# Patient Record
Sex: Male | Born: 1945 | Race: White | Hispanic: No | Marital: Married | Smoking: Former smoker
Health system: Southern US, Community
[De-identification: ages and names within clinical notes are randomized; demographics above are authoritative.]

## PROBLEM LIST (undated history)

## (undated) DIAGNOSIS — I4719 Other supraventricular tachycardia: Secondary | ICD-10-CM

## (undated) DIAGNOSIS — I639 Cerebral infarction, unspecified: Secondary | ICD-10-CM

## (undated) DIAGNOSIS — E119 Type 2 diabetes mellitus without complications: Secondary | ICD-10-CM

## (undated) DIAGNOSIS — I779 Disorder of arteries and arterioles, unspecified: Secondary | ICD-10-CM

## (undated) DIAGNOSIS — R609 Edema, unspecified: Secondary | ICD-10-CM

## (undated) DIAGNOSIS — E875 Hyperkalemia: Secondary | ICD-10-CM

## (undated) DIAGNOSIS — N183 Chronic kidney disease, stage 3 unspecified: Secondary | ICD-10-CM

## (undated) DIAGNOSIS — I447 Left bundle-branch block, unspecified: Secondary | ICD-10-CM

## (undated) DIAGNOSIS — IMO0001 Reserved for inherently not codable concepts without codable children: Secondary | ICD-10-CM

## (undated) DIAGNOSIS — I10 Essential (primary) hypertension: Secondary | ICD-10-CM

## (undated) DIAGNOSIS — Z862 Personal history of diseases of the blood and blood-forming organs and certain disorders involving the immune mechanism: Secondary | ICD-10-CM

## (undated) DIAGNOSIS — I739 Peripheral vascular disease, unspecified: Secondary | ICD-10-CM

## (undated) DIAGNOSIS — I471 Supraventricular tachycardia: Secondary | ICD-10-CM

## (undated) DIAGNOSIS — Z794 Long term (current) use of insulin: Secondary | ICD-10-CM

## (undated) HISTORY — PX: NECK SURGERY: SHX720

## (undated) HISTORY — DX: Personal history of diseases of the blood and blood-forming organs and certain disorders involving the immune mechanism: Z86.2

## (undated) HISTORY — PX: BACK SURGERY: SHX140

---

## 2005-12-04 ENCOUNTER — Ambulatory Visit: Payer: Self-pay | Admitting: Critical Care Medicine

## 2005-12-04 ENCOUNTER — Inpatient Hospital Stay (HOSPITAL_COMMUNITY): Admission: AD | Admit: 2005-12-04 | Discharge: 2005-12-13 | Payer: Self-pay | Admitting: Cardiology

## 2005-12-04 ENCOUNTER — Ambulatory Visit: Payer: Self-pay | Admitting: Cardiology

## 2005-12-28 ENCOUNTER — Ambulatory Visit: Payer: Self-pay | Admitting: Cardiology

## 2016-07-07 ENCOUNTER — Inpatient Hospital Stay (HOSPITAL_COMMUNITY)
Admission: AD | Admit: 2016-07-07 | Discharge: 2016-07-09 | DRG: 638 | Disposition: A | Discharge status: Home / Self Care | Payer: Medicare Other | Source: Other Acute Inpatient Hospital | Attending: Cardiology | Admitting: Cardiology

## 2016-07-07 ENCOUNTER — Encounter (HOSPITAL_COMMUNITY): Payer: Self-pay | Admitting: Student

## 2016-07-07 ENCOUNTER — Inpatient Hospital Stay (HOSPITAL_COMMUNITY): Payer: Medicare Other

## 2016-07-07 DIAGNOSIS — R600 Localized edema: Secondary | ICD-10-CM

## 2016-07-07 DIAGNOSIS — I248 Other forms of acute ischemic heart disease: Secondary | ICD-10-CM | POA: Diagnosis not present

## 2016-07-07 DIAGNOSIS — R0989 Other specified symptoms and signs involving the circulatory and respiratory systems: Secondary | ICD-10-CM

## 2016-07-07 DIAGNOSIS — I447 Left bundle-branch block, unspecified: Secondary | ICD-10-CM | POA: Diagnosis present

## 2016-07-07 DIAGNOSIS — E1122 Type 2 diabetes mellitus with diabetic chronic kidney disease: Secondary | ICD-10-CM | POA: Diagnosis present

## 2016-07-07 DIAGNOSIS — N183 Chronic kidney disease, stage 3 unspecified: Secondary | ICD-10-CM

## 2016-07-07 DIAGNOSIS — I360 Nonrheumatic tricuspid (valve) stenosis: Secondary | ICD-10-CM | POA: Diagnosis not present

## 2016-07-07 DIAGNOSIS — E875 Hyperkalemia: Secondary | ICD-10-CM | POA: Diagnosis present

## 2016-07-07 DIAGNOSIS — Z8673 Personal history of transient ischemic attack (TIA), and cerebral infarction without residual deficits: Secondary | ICD-10-CM

## 2016-07-07 DIAGNOSIS — E11649 Type 2 diabetes mellitus with hypoglycemia without coma: Secondary | ICD-10-CM | POA: Diagnosis present

## 2016-07-07 DIAGNOSIS — I252 Old myocardial infarction: Secondary | ICD-10-CM | POA: Diagnosis not present

## 2016-07-07 DIAGNOSIS — Z8249 Family history of ischemic heart disease and other diseases of the circulatory system: Secondary | ICD-10-CM | POA: Diagnosis not present

## 2016-07-07 DIAGNOSIS — R748 Abnormal levels of other serum enzymes: Secondary | ICD-10-CM | POA: Diagnosis not present

## 2016-07-07 DIAGNOSIS — I129 Hypertensive chronic kidney disease with stage 1 through stage 4 chronic kidney disease, or unspecified chronic kidney disease: Secondary | ICD-10-CM | POA: Diagnosis present

## 2016-07-07 DIAGNOSIS — I739 Peripheral vascular disease, unspecified: Secondary | ICD-10-CM

## 2016-07-07 DIAGNOSIS — R7989 Other specified abnormal findings of blood chemistry: Secondary | ICD-10-CM

## 2016-07-07 DIAGNOSIS — N184 Chronic kidney disease, stage 4 (severe): Secondary | ICD-10-CM | POA: Diagnosis present

## 2016-07-07 DIAGNOSIS — D649 Anemia, unspecified: Secondary | ICD-10-CM | POA: Diagnosis not present

## 2016-07-07 DIAGNOSIS — R4182 Altered mental status, unspecified: Secondary | ICD-10-CM

## 2016-07-07 DIAGNOSIS — Z794 Long term (current) use of insulin: Secondary | ICD-10-CM | POA: Diagnosis not present

## 2016-07-07 DIAGNOSIS — R410 Disorientation, unspecified: Secondary | ICD-10-CM | POA: Diagnosis not present

## 2016-07-07 DIAGNOSIS — E119 Type 2 diabetes mellitus without complications: Secondary | ICD-10-CM

## 2016-07-07 DIAGNOSIS — IMO0001 Reserved for inherently not codable concepts without codable children: Secondary | ICD-10-CM

## 2016-07-07 DIAGNOSIS — R079 Chest pain, unspecified: Secondary | ICD-10-CM | POA: Diagnosis not present

## 2016-07-07 DIAGNOSIS — J449 Chronic obstructive pulmonary disease, unspecified: Secondary | ICD-10-CM | POA: Diagnosis present

## 2016-07-07 DIAGNOSIS — I471 Supraventricular tachycardia: Secondary | ICD-10-CM | POA: Diagnosis present

## 2016-07-07 DIAGNOSIS — D539 Nutritional anemia, unspecified: Secondary | ICD-10-CM

## 2016-07-07 DIAGNOSIS — N179 Acute kidney failure, unspecified: Secondary | ICD-10-CM | POA: Diagnosis present

## 2016-07-07 DIAGNOSIS — E785 Hyperlipidemia, unspecified: Secondary | ICD-10-CM | POA: Diagnosis present

## 2016-07-07 DIAGNOSIS — I10 Essential (primary) hypertension: Secondary | ICD-10-CM

## 2016-07-07 DIAGNOSIS — I779 Disorder of arteries and arterioles, unspecified: Secondary | ICD-10-CM

## 2016-07-07 DIAGNOSIS — R778 Other specified abnormalities of plasma proteins: Secondary | ICD-10-CM | POA: Diagnosis present

## 2016-07-07 DIAGNOSIS — E162 Hypoglycemia, unspecified: Secondary | ICD-10-CM

## 2016-07-07 HISTORY — DX: Cerebral infarction, unspecified: I63.9

## 2016-07-07 HISTORY — DX: Chronic kidney disease, stage 3 (moderate): N18.3

## 2016-07-07 HISTORY — DX: Peripheral vascular disease, unspecified: I73.9

## 2016-07-07 HISTORY — DX: Essential (primary) hypertension: I10

## 2016-07-07 HISTORY — DX: Reserved for inherently not codable concepts without codable children: IMO0001

## 2016-07-07 HISTORY — DX: Disorder of arteries and arterioles, unspecified: I77.9

## 2016-07-07 HISTORY — DX: Other supraventricular tachycardia: I47.19

## 2016-07-07 HISTORY — DX: Long term (current) use of insulin: Z79.4

## 2016-07-07 HISTORY — DX: Hyperkalemia: E87.5

## 2016-07-07 HISTORY — DX: Chronic kidney disease, stage 3 unspecified: N18.30

## 2016-07-07 HISTORY — DX: Edema, unspecified: R60.9

## 2016-07-07 HISTORY — DX: Supraventricular tachycardia: I47.1

## 2016-07-07 HISTORY — DX: Type 2 diabetes mellitus without complications: E11.9

## 2016-07-07 HISTORY — DX: Left bundle-branch block, unspecified: I44.7

## 2016-07-07 LAB — ECHOCARDIOGRAM COMPLETE
Ao-asc: 36 cm
CHL CUP LV S' LATERAL: 6.09 cm/s
FS: 29 % (ref 28–44)
Height: 67 in
IVS/LV PW RATIO, ED: 1.11
LA diam end sys: 35 mm
LA vol index: 35.1 mL/m2
LA vol: 63.9 mL
LADIAMINDEX: 1.92 cm/m2
LASIZE: 35 mm
LAVOLA4C: 51.4 mL
LDCA: 3.14 cm2
LV TDI E'LATERAL: 6.42
LVELAT: 6.42 cm/s
LVOTD: 20 mm
PW: 9 mm — AB (ref 0.6–1.1)
RV LATERAL S' VELOCITY: 12.4 cm/s
RV TAPSE: 18.5 mm
Reg peak vel: 304 cm/s
TDI e' medial: 53.4
TR max vel: 304 cm/s
Weight: 2481.6 oz

## 2016-07-07 LAB — BASIC METABOLIC PANEL
ANION GAP: 6 (ref 5–15)
ANION GAP: 9 (ref 5–15)
BUN: 50 mg/dL — ABNORMAL HIGH (ref 6–20)
BUN: 52 mg/dL — ABNORMAL HIGH (ref 6–20)
CHLORIDE: 108 mmol/L (ref 101–111)
CHLORIDE: 107 mmol/L (ref 101–111)
CO2: 23 mmol/L (ref 22–32)
CO2: 20 mmol/L — AB (ref 22–32)
CREATININE: 1.98 mg/dL — AB (ref 0.61–1.24)
Calcium: 8.6 mg/dL — ABNORMAL LOW (ref 8.9–10.3)
Calcium: 8.6 mg/dL — ABNORMAL LOW (ref 8.9–10.3)
Creatinine, Ser: 1.84 mg/dL — ABNORMAL HIGH (ref 0.61–1.24)
GFR calc non Af Amer: 35 mL/min — ABNORMAL LOW (ref >60)
GFR calc non Af Amer: 32 mL/min — ABNORMAL LOW (ref >60)
GFR, EST AFRICAN AMERICAN: 41 mL/min — AB (ref >60)
GFR, EST AFRICAN AMERICAN: 37 mL/min — AB (ref >60)
GLUCOSE: 98 mg/dL (ref 65–99)
Glucose, Bld: 145 mg/dL — ABNORMAL HIGH (ref 65–99)
POTASSIUM: 6.5 mmol/L — AB (ref 3.5–5.1)
Potassium: 6.3 mmol/L (ref 3.5–5.1)
SODIUM: 136 mmol/L (ref 135–145)
Sodium: 137 mmol/L (ref 135–145)

## 2016-07-07 LAB — GLUCOSE, CAPILLARY
GLUCOSE-CAPILLARY: 137 mg/dL — AB (ref 65–99)
GLUCOSE-CAPILLARY: 182 mg/dL — AB (ref 65–99)
Glucose-Capillary: 67 mg/dL (ref 65–99)
Glucose-Capillary: 99 mg/dL (ref 65–99)
Glucose-Capillary: 205 mg/dL — ABNORMAL HIGH (ref 65–99)

## 2016-07-07 LAB — MRSA PCR SCREENING: MRSA BY PCR: INVALID — AB

## 2016-07-07 LAB — TROPONIN I: Troponin I: 0.07 ng/mL (ref <0.03)

## 2016-07-07 MED ORDER — PNEUMOCOCCAL VAC POLYVALENT 25 MCG/0.5ML IJ INJ: 0.5000 mL | INJECTION | INTRAMUSCULAR | Status: DC

## 2016-07-07 MED ORDER — LISINOPRIL 40 MG PO TABS
40.0000 mg | ORAL_TABLET | Freq: Every day | ORAL | Status: DC
  Administered 2016-07-07: 40 mg via ORAL
  Filled 2016-07-07: qty 2
  Filled 2016-07-07: qty 1

## 2016-07-07 MED ORDER — SODIUM POLYSTYRENE SULFONATE 15 GM/60ML PO SUSP
30.0000 g | Freq: Once | ORAL | Status: AC
  Administered 2016-07-07: 30 g via ORAL
  Filled 2016-07-07: qty 120

## 2016-07-07 MED ORDER — METOPROLOL SUCCINATE ER 50 MG PO TB24
50.0000 mg | ORAL_TABLET | Freq: Every day | ORAL | Status: DC
  Administered 2016-07-07 – 2016-07-09 (×3): 50 mg via ORAL
  Filled 2016-07-07 (×3): qty 1

## 2016-07-07 MED ORDER — PANTOPRAZOLE SODIUM 40 MG PO TBEC
40.0000 mg | DELAYED_RELEASE_TABLET | Freq: Every day | ORAL | Status: DC
  Administered 2016-07-07 – 2016-07-09 (×3): 40 mg via ORAL
  Filled 2016-07-07 (×3): qty 1

## 2016-07-07 MED ORDER — ONDANSETRON HCL 4 MG/2ML IJ SOLN
4.0000 mg | Freq: Four times a day (QID) | INTRAMUSCULAR | Status: DC | PRN
  Filled 2016-07-07: qty 2

## 2016-07-07 MED ORDER — NITROGLYCERIN 0.4 MG SL SUBL: 0.4000 mg | SUBLINGUAL_TABLET | SUBLINGUAL | Status: DC | PRN

## 2016-07-07 MED ORDER — ASPIRIN EC 81 MG PO TBEC
81.0000 mg | DELAYED_RELEASE_TABLET | Freq: Every day | ORAL | Status: DC
  Administered 2016-07-07 – 2016-07-09 (×3): 81 mg via ORAL
  Filled 2016-07-07 (×3): qty 1

## 2016-07-07 MED ORDER — SPIRONOLACTONE 25 MG PO TABS
50.0000 mg | ORAL_TABLET | Freq: Every day | ORAL | Status: DC
  Administered 2016-07-07: 50 mg via ORAL
  Filled 2016-07-07 (×2): qty 2

## 2016-07-07 MED ORDER — DEXTROSE 50 % IV SOLN: 25.0000 mL | Freq: Once | INTRAVENOUS | Status: DC

## 2016-07-07 MED ORDER — ATORVASTATIN CALCIUM 20 MG PO TABS
20.0000 mg | ORAL_TABLET | Freq: Every day | ORAL | Status: DC
  Administered 2016-07-07 – 2016-07-08 (×2): 20 mg via ORAL
  Filled 2016-07-07 (×2): qty 1

## 2016-07-07 MED ORDER — INSULIN ASPART 100 UNIT/ML ~~LOC~~ SOLN
0.0000 [IU] | Freq: Three times a day (TID) | SUBCUTANEOUS | Status: DC
  Administered 2016-07-07: 3 [IU] via SUBCUTANEOUS
  Administered 2016-07-07: 2 [IU] via SUBCUTANEOUS
  Administered 2016-07-08: 5 [IU] via SUBCUTANEOUS
  Administered 2016-07-08: 2 [IU] via SUBCUTANEOUS
  Administered 2016-07-09: 5 [IU] via SUBCUTANEOUS

## 2016-07-07 MED ORDER — ENOXAPARIN SODIUM 40 MG/0.4ML ~~LOC~~ SOLN
40.0000 mg | SUBCUTANEOUS | Status: DC
  Filled 2016-07-07: qty 0.4

## 2016-07-07 MED ORDER — ACETAMINOPHEN 325 MG PO TABS: 650.0000 mg | ORAL_TABLET | ORAL | Status: DC | PRN

## 2016-07-07 MED ORDER — DEXTROSE 50 % IV SOLN: 25.0000 mL | Freq: Once | INTRAVENOUS | Status: AC

## 2016-07-07 MED ORDER — FUROSEMIDE 20 MG PO TABS
20.0000 mg | ORAL_TABLET | Freq: Every day | ORAL | Status: DC
  Administered 2016-07-07: 20 mg via ORAL
  Filled 2016-07-07 (×2): qty 1

## 2016-07-07 NOTE — Progress Notes (Signed)
  Notified of patient's elevated K+ of 6.3. Will give Kayexalate 30 g now. Repeat BMET at 1500.  Signed, Erma Heritage, PA-C 07/07/2016, 10:52 AM Pager: [phone]

## 2016-07-07 NOTE — Progress Notes (Signed)
Tech offered Pt a bath. Pt sitting in chair going to sleep and stated she will take care her bath later.  Tech asked Pt would she like some assistance with bath. Pt stated no.

## 2016-07-07 NOTE — Progress Notes (Signed)
[place], [place] for Cardiology at bedside for eval; pt AxO x4; neuro assessment negative; speech clear.  Pt requests something to drink.  According to record, pt passed swallow eval; given OJ instead of 1/2 amp D50 per order of [place], [place].  Await further orders.

## 2016-07-07 NOTE — Progress Notes (Signed)
Echocardiogram 2D Echocardiogram has been performed.  Joelene Millin 07/07/2016, 12:04 PM

## 2016-07-07 NOTE — Progress Notes (Signed)
CRITICAL VALUE ALERT  Critical Value:  K+ 6.3; Trop .007  Date & Time Notified:  07/07/2016 @ 1015  Provider Notified: Bernerd Pho, PA for Cardiology  Orders Received/Actions taken: [place] states she will address; called to stat procedure

## 2016-07-07 NOTE — Progress Notes (Signed)
Have attempted to call report to 2W x 2 unsuccessfully.  Will continue attempts.

## 2016-07-07 NOTE — Progress Notes (Signed)
Pt arrived on unit. Vitals stable. Pt alert and oriented X4. No orders received. CBG 64. Will recheck. Paged MD for orders.

## 2016-07-07 NOTE — Progress Notes (Signed)
*  PRELIMINARY RESULTS* Vascular Ultrasound Carotid Duplex (Doppler) has been completed.  Preliminary findings: Findings consistent with a high end 1-39 percent stenosis involving the right internal carotid artery and the left external carotid artery.   Stenosis could be underestimated due to large amount of calcified plaque.  Bilateral vertebral arteries appear patent and antegrade.  Everrett Coombe 07/07/2016, 2:51 PM

## 2016-07-07 NOTE — H&P (Signed)
History & Physical    Patient ID: Larry Mitchell MRN: [id number], DOB/AGE: July 21, 1945   Admit date: 07/07/2016   Primary Physician: [facility], [place] Primary Cardiologist: New to [facility] - Followed by [place], [place]   History of Present Illness    Larry Mitchell is a 71 y.o. male with past medical history of IDDM, COPD, HTN, HLD, and Stage 3 CKD who presents to [facility] on 07/07/2016 for evaluation of weakness, unresponsiveness, and elevated troponin values.   He presented to [facility] on 07/06/2016 for an episode of bilateral upper extremity weakness and unresponsiveness. The patient's wife reported he was last normal at 2030 the evening of presentation but she awoke from sleep to find him on the groaning and minimally responsive. She initially thought his glucose levels were low, so she administered Glucagon and called 911. Last night, he does recall taking his 21 units of Humulin 70/30 and then falling asleep without consuming any food. Last meal had been earlier in the day around lunchtime.   He reports being in his usual state of health for the past several weeks. 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 He denies any recent chest pain, dyspnea on exertion, orthopnea, PND, or palpitations. Does have chronic lower extremity edema which is followed by the [facility] (on Lasix '20mg'$  daily PTA). Says weight has been stable at 150 - 155 lbs on his home scales.  He reports having a cardiac catheterization 10+ years ago which was normal. No stent placement or PCI performed at that time. Had an echocardiogram last month at [facility] but he is unable to recall the results of this study.   Labs showed WBC of 10.6, Hgb 9.3, platelet count 198, creatinine 1.81 (unknown baseline), glucose 289, Alk Phos 97, AST 67.5, ALT 41. Na+ 135, K+ 5.2. Troponin 0.05 with repeat value of 0.09. EKG shows NSR, HR 70, with LBBB  (unknown old vs. new). CT Head showed no evidence of large-vessel occlusion. Plaque along the carotid arteries was noted with 40% stenosis along the right and 25% along the left.   Past Medical History    Past Medical History:  Diagnosis Date  . CKD (chronic kidney disease) stage 3, GFR 30-59 ml/min   . Essential hypertension   . IDDM (insulin dependent diabetes mellitus) ([place])     No past surgical history on file.   Allergies  Allergies not on file   Home Medications    Prior to Admission medications   Not on File    Family History    Family History  Problem Relation Age of Onset  . Heart failure Mother        Age of onset - 18    Social History    Social History   Social History  . Marital status: Married    Spouse name: [name]  . Number of children: [name]  . Years of education: [name]   Occupational History  . Not on file.   Social History Main Topics  . Smoking status: Never Smoker  . Smokeless tobacco: Never Used  . Alcohol use 0.6 oz/week    1 Cans of beer per week  . Drug use: No  . Sexual activity: Not on file   Other Topics Concern  . Not on file   Social History Narrative  . No narrative on file     Review of Systems  General:  No chills, fever, night sweats or weight changes.  Cardiovascular:  No chest pain, dyspnea on exertion, edema, orthopnea, palpitations, paroxysmal nocturnal dyspnea. Dermatological: No rash, lesions/masses Respiratory: No cough, dyspnea Urologic: No hematuria, dysuria Abdominal:   No nausea, vomiting, diarrhea, bright red blood per rectum, melena, or hematemesis Neurologic:  No visual changes. Positive for wkns and changes in mental status.  All other systems reviewed and are otherwise negative except as noted above.  Physical Exam    Blood pressure (!) 143/57, pulse 64, temperature 97.9 F (36.6 C), temperature source Oral, resp. rate 15, height 5\' 7"  (1.702 m), weight 155 lb 1.6 oz (70.4 kg), SpO2 100 %.    General: Well developed, well nourished Caucasian male appearing in no acute distress. Head: Normocephalic, atraumatic, sclera non-icteric, no xanthomas, nares are without discharge. Neck: Left carotid bruit noted. JVD not elevated.  Lungs: Respirations regular and unlabored, without wheezes or rales.  Heart: Regular rate and rhythm. No S3 or S4.  No murmur, no rubs, or gallops appreciated. Abdomen: Soft, non-tender, non-distended with normoactive bowel sounds. No hepatomegaly. No rebound/guarding. No obvious abdominal masses. Msk:  Strength and tone appear normal for age. No joint deformities or effusions. Extremities: No clubbing or cyanosis. 1+ pitting edema up to mid-shins bilaterally.  Distal pedal pulses are 2+ bilaterally. Neuro: Alert and oriented X 3. Moves all extremities spontaneously. No focal deficits noted. Psych:  Responds to questions appropriately with a normal affect. Skin: No rashes or lesions noted  Labs    Troponin (Point of Care Test) No results for input(s): TROPIPOC in the last 72 hours. No results for input(s): CKTOTAL, CKMB, TROPONINI in the last 72 hours. No results found for: WBC, HGB, HCT, MCV, PLT No results for input(s): NA, K, CL, CO2, BUN, CREATININE, CALCIUM, PROT, BILITOT, ALKPHOS, ALT, AST, GLUCOSE in the last 168 hours.  Invalid input(s): LABALBU No results found for: CHOL, HDL, LDLCALC, TRIG No results found for: DDIMER  No results found for: BNP No results found for: PROBNP No results for input(s): INR in the last 72 hours.    Radiology Studies    CT Head (at [facility] -  showed no evidence of large-vessel occlusion. Plaque along the carotid arteries was noted with 40% stenosis along the right and 25% along the left.    EKG & Cardiac Imaging    EKG: NSR, HR 70, with LBBB (unknown old vs. new). - Personally Reviewed  ECHOCARDIOGRAM: None on File  Assessment & Plan    1. Elevated Troponin  - the patient presented to [facility]  with weakness and AMS, occurring in the setting of presumed hypoglycemia. He denies any recent chest pain or dyspnea on exertion but troponin values were checked while in the ED and found to be elevated at 0.05 and 0.09. - EKG shows NSR, HR 70, with LBBB (unknown old vs. new). - he does have multiple cardiac risk factors including IDDM, HTN, and HLD. Last cardiac catheterization was 10+ years ago.  - will discuss further with [facility]. Would favor ischemic evaluation with a NST over cardiac catheterization at this time in the setting of his renal insufficiency. Recheck troponin value.   2. IDDM - on Humulin 70/30 as an outpatient. Hypoglycemic at 73 this AM.  - SSI while admitted until glucose levels normalize. Consult Diabetes Coordinator.   3. Stage 3 CKD - creatinine at 1.81 at [facility] (unknown baseline). - repeat BMET pending.  4. Lower Extremity Edema - chronic  according to the patient. Just had an echo performed last month at [facility].  - continue PTA Lasix. Hold K+ supplementation as K+ was at 5.2 on most recent check.   5. AMS - occurred in the setting of assumed hypoglycemia with resolution of his symptoms following administration of Glucagon. - CT Head at OSH without acute intracranial abnormalities.  - continue to monitor.   6. Anemia - Hgb at 9.3. Unknown baseline.  - he denies any evidence of active bleeding. - will need to be further evaluated as an outpatient.   Signed, Erma Heritage, PA-C 07/07/2016, 8:37 AM Pager: [phone]  History and all data above reviewed.  Patient examined.  I agree with the findings as above. The patient was admitted with AMS after not eating and taking his full insulin.  This is apparently a pattern.  He has poorly controlled DM and has infrequent follow up at the [place] and no local MDs.  His wife says that he has a poor lifestyle and much of his poor sugar management is secondary to his compliance or understanding.  He had  elevated troponin.  He denies any chest pain or SOB.  He does still work.  He looks to be chronically ill but reports no acute complaints.  He has chronic leg weakness. The patient exam reveals COR:RRR  ,  Lungs: Clear  ,  Abd: Distended, Ext Moderate leg edema,  Neck:right Bruit  .  All available labs, radiology testing, previous records reviewed. Agree with documented assessment and plan. AMS :  This was likely related to low BS.  We will ask the diabetes nurse educator to get involved.  Elevated troponin:  I don't strongly suspect an acute coronary syndrome.  However, he has significant risk factors and I would like to screen with a Lexiscan Myoview.    Edema:  Check echocardiogram.   Bruit:  Check Doppler.   Jeneen Rinks Chyanna Flock  9:40 AM  07/07/2016

## 2016-07-08 ENCOUNTER — Inpatient Hospital Stay (HOSPITAL_COMMUNITY): Payer: Medicare Other

## 2016-07-08 DIAGNOSIS — R748 Abnormal levels of other serum enzymes: Secondary | ICD-10-CM

## 2016-07-08 DIAGNOSIS — R079 Chest pain, unspecified: Secondary | ICD-10-CM

## 2016-07-08 LAB — BASIC METABOLIC PANEL
Anion gap: 7 (ref 5–15)
BUN: 50 mg/dL — ABNORMAL HIGH (ref 6–20)
CALCIUM: 8 mg/dL — AB (ref 8.9–10.3)
CO2: 20 mmol/L — ABNORMAL LOW (ref 22–32)
CREATININE: 2.2 mg/dL — AB (ref 0.61–1.24)
Chloride: 107 mmol/L (ref 101–111)
GFR calc non Af Amer: 28 mL/min — ABNORMAL LOW (ref >60)
GFR, EST AFRICAN AMERICAN: 33 mL/min — AB (ref >60)
Glucose, Bld: 143 mg/dL — ABNORMAL HIGH (ref 65–99)
Potassium: 5.6 mmol/L — ABNORMAL HIGH (ref 3.5–5.1)
SODIUM: 134 mmol/L — AB (ref 135–145)

## 2016-07-08 LAB — NM MYOCAR MULTI W/SPECT W/WALL MOTION / EF
CHL CUP RESTING HR STRESS: 65 {beats}/min
CSEPEDS: 0 s
CSEPHR: 54 %
CSEPPHR: 80 {beats}/min
Estimated workload: 1 METS
Exercise duration (min): 0 min
MPHR: 148 {beats}/min

## 2016-07-08 LAB — VAS US CAROTID
LCCADSYS: -93 cm/s
LCCAPDIAS: 26 cm/s
LCCAPSYS: 108 cm/s
LEFT ECA DIAS: -22 cm/s
LEFT VERTEBRAL DIAS: -15 cm/s
LICAPDIAS: -39 cm/s
Left CCA dist dias: -21 cm/s
Left ICA dist dias: -30 cm/s
Left ICA dist sys: -132 cm/s
Left ICA prox sys: -226 cm/s
RCCAPDIAS: 13 cm/s
RCCAPSYS: 97 cm/s
RIGHT ECA DIAS: 24 cm/s
RIGHT VERTEBRAL DIAS: -33 cm/s
Right cca dist sys: -133 cm/s

## 2016-07-08 LAB — TROPONIN I: TROPONIN I: 0.12 ng/mL — AB (ref <0.03)

## 2016-07-08 LAB — GLUCOSE, CAPILLARY
GLUCOSE-CAPILLARY: 110 mg/dL — AB (ref 65–99)
Glucose-Capillary: 148 mg/dL — ABNORMAL HIGH (ref 65–99)
Glucose-Capillary: 222 mg/dL — ABNORMAL HIGH (ref 65–99)
Glucose-Capillary: 210 mg/dL — ABNORMAL HIGH (ref 65–99)

## 2016-07-08 LAB — MRSA CULTURE: CULTURE: NOT DETECTED

## 2016-07-08 MED ORDER — ALBUTEROL SULFATE (2.5 MG/3ML) 0.083% IN NEBU
2.5000 mg | INHALATION_SOLUTION | Freq: Four times a day (QID) | RESPIRATORY_TRACT | Status: DC | PRN
  Administered 2016-07-08: 2.5 mg via RESPIRATORY_TRACT
  Filled 2016-07-08: qty 3

## 2016-07-08 MED ORDER — TECHNETIUM TC 99M TETROFOSMIN IV KIT
31.0000 | PACK | Freq: Once | INTRAVENOUS | Status: AC | PRN
  Administered 2016-07-08: 31 via INTRAVENOUS

## 2016-07-08 MED ORDER — TIOTROPIUM BROMIDE MONOHYDRATE 18 MCG IN CAPS
18.0000 ug | ORAL_CAPSULE | Freq: Every day | RESPIRATORY_TRACT | Status: DC
  Administered 2016-07-08 – 2016-07-09 (×2): 18 ug via RESPIRATORY_TRACT
  Filled 2016-07-08 (×2): qty 5

## 2016-07-08 MED ORDER — ALBUTEROL SULFATE HFA 108 (90 BASE) MCG/ACT IN AERS: 1.0000 | INHALATION_SPRAY | Freq: Four times a day (QID) | RESPIRATORY_TRACT | Status: DC | PRN

## 2016-07-08 MED ORDER — REGADENOSON 0.4 MG/5ML IV SOLN
INTRAVENOUS | Status: AC
  Filled 2016-07-08: qty 5

## 2016-07-08 MED ORDER — TECHNETIUM TC 99M TETROFOSMIN IV KIT
10.0000 | PACK | Freq: Once | INTRAVENOUS | Status: AC | PRN
  Administered 2016-07-08: 10 via INTRAVENOUS

## 2016-07-08 MED ORDER — REGADENOSON 0.4 MG/5ML IV SOLN
0.4000 mg | Freq: Once | INTRAVENOUS | Status: AC
  Administered 2016-07-08: 0.4 mg via INTRAVENOUS
  Filled 2016-07-08: qty 5

## 2016-07-08 MED ORDER — ENOXAPARIN SODIUM 30 MG/0.3ML ~~LOC~~ SOLN
30.0000 mg | SUBCUTANEOUS | Status: DC
  Administered 2016-07-08: 30 mg via SUBCUTANEOUS
  Filled 2016-07-08: qty 0.3

## 2016-07-08 NOTE — Progress Notes (Signed)
CCMD called to notify pt had 15 beats of wide QRS with tachycardia in 150s.  Pt now NSR in 80s and resting comfortably in bed.  CCMD saved strip for review.

## 2016-07-08 NOTE — Progress Notes (Signed)
Progress Note  Patient Name: Larry Mitchell Date of Encounter: 07/08/2016  Primary Cardiologist: [place], [place]   He denies any recent chest pain or palpitations. Breathing at baseline. Seen in Nuclear Medicine for 1-day NST.   Inpatient Medications    Scheduled Meds: . regadenoson      . aspirin EC  81 mg Oral Daily  . atorvastatin  20 mg Oral q1800  . dextrose  25 mL Intravenous Once  . enoxaparin (LOVENOX) injection  40 mg Subcutaneous Q24H  . furosemide  20 mg Oral Daily  . insulin aspart  0-15 Units Subcutaneous TID WC  . lisinopril  40 mg Oral Daily  . metoprolol succinate  50 mg Oral Daily  . pantoprazole  40 mg Oral Daily  . pneumococcal 23 valent vaccine  0.5 mL Intramuscular Tomorrow-1000  . spironolactone  50 mg Oral Daily   Continuous Infusions:  PRN Meds: acetaminophen, nitroGLYCERIN, ondansetron (ZOFRAN) IV   Vital Signs    Vitals:   07/07/16 1938 07/08/16 0411 07/08/16 0900 07/08/16 0930  BP: (!) 138/53 (!) 138/49 (!) 148/57 (!) 142/56  Pulse: 73 79    Resp: 18 18    Temp: 99.1 F (37.3 C) 98.4 F (36.9 C)    TempSrc: Oral Oral    SpO2: 95% 93%    Weight:  152 lb 12.8 oz (69.3 kg)    Height:        Intake/Output Summary (Last 24 hours) at 07/08/16 0932 Last data filed at 07/07/16 0945  Gross per 24 hour  Intake              240 ml  Output                0 ml  Net              240 ml   Filed Weights   07/07/16 0623 07/08/16 0411  Weight: 155 lb 1.6 oz (70.4 kg) 152 lb 12.8 oz (69.3 kg)    Telemetry    Not reviewed, examined in Nuclear Medicine. - Personally Reviewed  ECG    SR with 1st degree AV Block, HR 74. - Personally Reviewed  Physical Exam   General: Well developed, well nourished Caucasian male appearing in no acute distress. Head: Normocephalic, atraumatic.  Neck: Right carotid bruit noted, JVD not elevated. Lungs:  Resp regular and unlabored, CTA without wheezing or rales. Heart: RRR, S1, S2, no S3, S4, or  murmur; no rub. Abdomen: Soft, non-tender, non-distended with normoactive bowel sounds. No hepatomegaly. No rebound/guarding. No obvious abdominal masses. Extremities: No clubbing or cyanosis. Chronic lower extremity edema. Distal pedal pulses are 2+ bilaterally. Neuro: Alert and oriented X 3. Moves all extremities spontaneously. Psych: Normal affect.  Labs    Chemistry Recent Labs Lab 07/07/16 0905 07/07/16 1333 07/08/16 0149  NA 137 136 134*  K 6.3* 6.5* 5.6*  CL 108 107 107  CO2 23 20* 20*  GLUCOSE 98 145* 143*  BUN 50* 52* 50*  CREATININE 1.84* 1.98* 2.20*  CALCIUM 8.6* 8.6* 8.0*  GFRNONAA 35* 32* 28*  GFRAA 41* 37* 33*  ANIONGAP 6 9 7      HematologyNo results for input(s): WBC, RBC, HGB, HCT, MCV, MCH, MCHC, RDW, PLT in the last 168 hours.  Cardiac Enzymes Recent Labs Lab 07/07/16 0905 07/08/16 0149  TROPONINI 0.07* 0.12*   No results for input(s): TROPIPOC in the last 168 hours.   BNPNo results for input(s): BNP, PROBNP in the  last 168 hours.   DDimer No results for input(s): DDIMER in the last 168 hours.   Radiology    No results found.  Cardiac Studies   Echocardiogram: 07/07/2016 Study Conclusions  - Left ventricle: The cavity size was normal. Wall thickness was   normal. Systolic function was normal. The estimated ejection   fraction was in the range of 55% to 60%. Wall motion was normal;   there were no regional wall motion abnormalities. Diastolic   dysfunction, grade indeterminate. - Mitral valve: Mildly calcified annulus. Normal thickness leaflets   . - Left atrium: The atrium was mildly dilated. - Tricuspid valve: There was mild regurgitation.   Patient Profile     71 y.o. male with past medical history of IDDM, COPD, HTN, HLD, and Stage 3 CKD who presented to [facility] on 07/07/2016 for evaluation of weakness, unresponsiveness, and elevated troponin values as a transfer from [place]    1. Elevated Troponin   - the patient presented to [facility] with weakness and AMS, occurring in the setting of presumed hypoglycemia. He denies any recent chest pain or dyspnea on exertion but troponin values were checked while in the ED and found to be elevated at 0.05, 0.09, and 0.12. - EKG shows NSR, HR 70, with LBBB (unknown old vs. new). - he does have multiple cardiac risk factors including IDDM, HTN, and HLD. Last cardiac catheterization was 10+ years ago.  - seen in Nuclear Medicine for 1-day NST. Official read pending by [facility] following stress images.   2. IDDM - on Humulin 70/30 as an outpatient.  - SSI while admitted until glucose levels normalize. Diabetes Coordinator has been consulted.   3. Stage 3 CKD  - creatinine at 1.81 at [facility]. Elevated to 2.20 this AM.  - unknown baseline. Repeat BMET in AM.   4. Lower Extremity Edema - chronic according to the patient. Echo shows a preserved EF of 55-60%.  - continue PTA Lasix.   5. AMS - occurred in the setting of assumed hypoglycemia with resolution of his symptoms following administration of- K+ supplementation held. May need to consider holdi Glucagon. - CT Head at OSH without acute intracranial abnormalities.  - continue to monitor.   6. Anemia - Hgb at 9.3. Unknown baseline.  - he denies any evidence of active bleeding. - will need to be further evaluated as an outpatient.   7. Hyperkalemia - occurring in the setting of hyperglycemia. Given Kayexalate 30g yesterday afternoon with K+ improved to 5.6 this AM. Consider discontinuing Lisinopril if this remains an issue.   8. Right Carotid Bruit - prelimiary read on doppler study shows 1-39% stenosis bilaterally. Could be underestimated due to calcified plaque.   Arna Medici , PA-C 9:32 AM 07/08/2016 Pager: [phone]  History and all data above reviewed.  Patient examined.  I agree with the findings as above. He has had no chest pain.  He has not  felt palpitations.  He denies any presyncope or syncope.  He has been up in the room.  Echo and carotid studies as above.  He did have what appears to be NSVT.  The patient exam reveals COR:RRR  ,  Lungs: Clear  ,  Abd: Positive bowel sounds, no rebound no guarding, Ext Edema reduced  .  All available labs, radiology testing, previous records reviewed. Agree with documented assessment and plan. ELEVATED TROPONIN:  Lexiscan Myoview results pending.  No further work up  if negative.  Hyperkalemia:  Given this and CKD I am going to stop the ACE inhibitor and spironolactone.  I am also going to hold the Lasix..  Need to follow potassium and creat in AM. BP meds will have to be decided in the AM.  I suspect he will be able to go home pending his labs and overnight tele.  Needs out patient event monitor.   Jeneen Rinks Khyle Goodell  12:28 PM  07/08/2016

## 2016-07-08 NOTE — Progress Notes (Signed)
Stress Test showed:    LBBB present.  Defect 1: There is a medium defect of moderate severity present in the mid inferoseptal, mid inferior, mid inferolateral, apical septal, apical inferior and apical lateral location.  Findings consistent with prior myocardial infarction with a mild degree of peri-infarct ischemia.  This is an intermediate risk study.  Nuclear stress EF: 48%  The patient and his wife both report he had a known MI 10+ years ago at which time PCI was not performed. With him denying any recent anginal symptoms and creatinine continuing to trend upwards, would not pursue a cardiac catheterization at this time. Continue to monitor on telemetry overnight. Plan for repeat BMET in AM to assess creatinine and kidney function. Possible discharge tomorrow if these stabilize.   Signed, Erma Heritage, PA-C 07/08/2016, 4:24 PM Pager: [phone]

## 2016-07-09 ENCOUNTER — Encounter (HOSPITAL_COMMUNITY): Payer: Self-pay | Admitting: Physician Assistant

## 2016-07-09 DIAGNOSIS — I447 Left bundle-branch block, unspecified: Secondary | ICD-10-CM | POA: Diagnosis present

## 2016-07-09 DIAGNOSIS — E875 Hyperkalemia: Secondary | ICD-10-CM

## 2016-07-09 DIAGNOSIS — I248 Other forms of acute ischemic heart disease: Secondary | ICD-10-CM

## 2016-07-09 DIAGNOSIS — I739 Peripheral vascular disease, unspecified: Secondary | ICD-10-CM

## 2016-07-09 DIAGNOSIS — D539 Nutritional anemia, unspecified: Secondary | ICD-10-CM

## 2016-07-09 DIAGNOSIS — I10 Essential (primary) hypertension: Secondary | ICD-10-CM

## 2016-07-09 DIAGNOSIS — R600 Localized edema: Secondary | ICD-10-CM

## 2016-07-09 DIAGNOSIS — I471 Supraventricular tachycardia: Secondary | ICD-10-CM

## 2016-07-09 DIAGNOSIS — I779 Disorder of arteries and arterioles, unspecified: Secondary | ICD-10-CM

## 2016-07-09 DIAGNOSIS — R410 Disorientation, unspecified: Secondary | ICD-10-CM

## 2016-07-09 DIAGNOSIS — E162 Hypoglycemia, unspecified: Secondary | ICD-10-CM

## 2016-07-09 LAB — BASIC METABOLIC PANEL
ANION GAP: 10 (ref 5–15)
BUN: 49 mg/dL — ABNORMAL HIGH (ref 6–20)
CALCIUM: 8.2 mg/dL — AB (ref 8.9–10.3)
CO2: 20 mmol/L — AB (ref 22–32)
Chloride: 103 mmol/L (ref 101–111)
Creatinine, Ser: 2.23 mg/dL — ABNORMAL HIGH (ref 0.61–1.24)
GFR, EST AFRICAN AMERICAN: 32 mL/min — AB (ref >60)
GFR, EST NON AFRICAN AMERICAN: 28 mL/min — AB (ref >60)
GLUCOSE: 211 mg/dL — AB (ref 65–99)
POTASSIUM: 5.1 mmol/L (ref 3.5–5.1)
Sodium: 133 mmol/L — ABNORMAL LOW (ref 135–145)

## 2016-07-09 LAB — CBC
HEMATOCRIT: 26.9 % — AB (ref 39.0–52.0)
Hemoglobin: 8.9 g/dL — ABNORMAL LOW (ref 13.0–17.0)
MCH: 32 pg (ref 26.0–34.0)
MCHC: 33.1 g/dL (ref 30.0–36.0)
MCV: 96.8 fL (ref 78.0–100.0)
PLATELETS: 186 10*3/uL (ref 150–400)
RBC: 2.78 MIL/uL — ABNORMAL LOW (ref 4.22–5.81)
RDW: 12.5 % (ref 11.5–15.5)
WBC: 8 10*3/uL (ref 4.0–10.5)

## 2016-07-09 LAB — GLUCOSE, CAPILLARY
Glucose-Capillary: 223 mg/dL — ABNORMAL HIGH (ref 65–99)
Glucose-Capillary: 215 mg/dL — ABNORMAL HIGH (ref 65–99)

## 2016-07-09 MED ORDER — INSULIN NPH ISOPHANE & REGULAR (70-30) 100 UNIT/ML ~~LOC~~ SUSP: 15.0000 [IU] | Freq: Two times a day (BID) | SUBCUTANEOUS | Status: AC

## 2016-07-09 MED ORDER — ASPIRIN 81 MG PO TABS: 81.0000 mg | ORAL_TABLET | Freq: Every day | ORAL | Status: AC

## 2016-07-09 NOTE — Discharge Summary (Signed)
Discharge Summary    Patient ID: Larry Mitchell,  MRN: [id number], DOB/AGE: 04-30-1945 71 y.o.  Admit date: 07/07/2016 Discharge date: 07/09/2016  Primary Care Provider: [facility], [place] Primary Cardiologist: New, usually followed for care at [facility] - will follow up with [facility] but ultimately would prefer f/u in [place] near where he lives  Discharge Diagnoses    Principal Problem:   Elevated troponin Active Problems:   Altered mental state   IDDM (insulin dependent diabetes mellitus) (HCC)   CKD (chronic kidney disease) stage 3, GFR 30-59 ml/min   Hypoglycemia   Anemia, unspecified   Hyperkalemia   Essential hypertension   Carotid artery disease (HCC)   PAT (paroxysmal atrial tachycardia) (HCC)   Lower extremity edema   LBBB (left bundle branch block)    Diagnostic Studies/Procedures    2D echo 07/07/16 Study Conclusions  - Left ventricle: The cavity size was normal. Wall thickness was   normal. Systolic function was normal. The estimated ejection   fraction was in the range of 55% to 60%. Wall motion was normal;   there were no regional wall motion abnormalities. Diastolic   dysfunction, grade indeterminate. - Mitral valve: Mildly calcified annulus. Normal thickness leaflets   . - Left atrium: The atrium was mildly dilated. - Tricuspid valve: There was mild regurgitation. _____________     History of Present Illness     Larry Mitchell is a 71 y.o. male with history of IDDM, COPD, HTN, HLD, CKD stage III (followed at [place]), chronic LEE, occult stroke (seen on CT at OSH this admission), possible remote MI >10 years ago who was admitted to [facility] upon transfer from [facility] for elevated troponin - had presented with weakness, unresponsiveness/groaning/drooling, felt due to hypoglycemia in the setting of taking insulin without eating.   Hospital Course    He presented to [facility] on 07/06/2016 for an episode of bilateral upper extremity weakness and  unresponsiveness. The patient's wife reported he was last normal at 2030 the evening of presentation but she awoke from sleep to find him on the groaning and minimally responsive. She initially thought his glucose levels were low, so she administered Glucagon and called 911. This has happened in the past. He improved with this treatment and later recalled taking his 21 units of Humulin 70/30 and then falling asleep without consuming any food (had last eaten at lunchtime). Otherwise he denied any recent CP, dyspnea on exertion, orthopnea, PND, or palpitations. He did report chronic lower extremity edema which is followed by the [facility] (on Lasix 20mg  daily PTA). Labs showed Cr 1.81 (unknown baseline but patient reports CKD for 3-4 years), hyperkalemia, mild troponin elevation 0.05 with repeat value of 0.09. EKG shows NSR, HR 70, with LBBB (unknown old vs. new). CT Head showed no evidence of large-vessel occlusion; was mention of occult infarct on imaging. There was plaque along the carotid arteries was noted with 40% stenosis along the right and 25% along the left. Due to his EKG and troponins he was transferred to [facility] for further evaluation.   The following issues were addressed:  1. Altered mental status - occurred in the setting of assumed hypoglycemia with resolution of his symptoms following administration of Glucagon. Will need close f/u with primary care to manage his insulin regimen. This occurred after administering insulin then going to bed without eating. At discharge, his home regimen was reduced from 21u BID to 15u BID. Given the description of the event, Dr. Marlou Porch  did not feel he required event monitor at discharge. Telemetry showed brief run of PAT but no PVCs or NSVT, and LVEF was normal.  2. Elevated troponin - low and flat, without any recent anginal symptoms. Not felt to represent ACS. Nuc showed prior myocardial infarction with a mild degree of peri-infarct ischemia, EF 48% - echo this  admission showed EF 55-60% without WMA, mildly dilated LA, mild TR. The patient and his wife both report he had a known MI 10+ years ago at which time PCI was not performed. Cardiac cath not pursued due to lack of anginal symptoms and renal insufficiency.   3. Anemia - unclear baseline, was 9.3 at OSH. Patient denied any bleeding. This was rechecked today and was 8.9. He was advised to f/u closely with PCP.  4. CKD stage III-IV with hyperkalemia - prompting d/c of lisinopril, spironolactone and Lasix. Would not resume lisinopril, spironolactone or diuretic at this time due to this. Baseline unknown but patient reports following with renal due to CKD for the past 3-4 years.  5. Lower extremity edema - patient states chronic, diuretics on hold at this time pending f/u of his renal function. Can consider resuming Lasix as OP contigent on creatinine - consider BMET at f/u visit.  6. HTN - this was followed as the above meds were held. SBP is now running 130s-140s off the above medications. Given above episode it was felt reasonable to follow for now with consideration of titrating BB/adding amlodipine if necessary. Diastolics were in the 09F-81W.  7. Carotid artery disease - carotid duplex done this admission to clarify above CT findings/carotid bruit showed high end 1-39% BICA, stenosis could be underestimated due to large amount of calcified - will need to be followed as OP.  8. Brief tachycardia - on 07/08/16 this was felt to represent PAT (similar axis as NSR, very brief, no symptoms).   Dr. Marlou Porch has seen and examined the patient today and feels he is stable for discharge. Will arrange close OP f/u. Also encouraged him to f/u PCP as well.  _____________  Discharge Vitals Blood pressure (!) 147/53, pulse 81, temperature 98.8 F (37.1 C), temperature source Oral, resp. rate 18, height 5\' 7"  (1.702 m), weight 153 lb 4.8 oz (69.5 kg), SpO2 90 %.  Filed Weights   07/07/16 0623 07/08/16 0411  07/09/16 0534  Weight: 155 lb 1.6 oz (70.4 kg) 152 lb 12.8 oz (69.3 kg) 153 lb 4.8 oz (69.5 kg)    Labs & Radiologic Studies    CBC  Recent Labs  07/09/16 1016  WBC 8.0  HGB 8.9*  HCT 26.9*  MCV 96.8  PLT 299   Basic Metabolic Panel  Recent Labs  07/08/16 0149 07/09/16 0147  NA 134* 133*  K 5.6* 5.1  CL 107 103  CO2 20* 20*  GLUCOSE 143* 211*  BUN 50* 49*  CREATININE 2.20* 2.23*  CALCIUM 8.0* 8.2*   Liver Function Tests No results for input(s): AST, ALT, ALKPHOS, BILITOT, PROT, ALBUMIN in the last 72 hours. No results for input(s): LIPASE, AMYLASE in the last 72 hours. Cardiac Enzymes  Recent Labs  07/07/16 0905 07/08/16 0149  TROPONINI 0.07* 0.12*   BNP Invalid input(s): POCBNP D-Dimer No results for input(s): DDIMER in the last 72 hours. Hemoglobin A1C No results for input(s): HGBA1C in the last 72 hours. Fasting Lipid Panel No results for input(s): CHOL, HDL, LDLCALC, TRIG, CHOLHDL, LDLDIRECT in the last 72 hours. Thyroid Function Tests No results for input(s): TSH,  T4TOTAL, T3FREE, THYROIDAB in the last 72 hours.  Invalid input(s): FREET3 _____________  Nm Myocar Multi W/spect W/wall Motion / Ef  Result Date: 07/08/2016  LBBB present.  Defect 1: There is a medium defect of moderate severity present in the mid inferoseptal, mid inferior, mid inferolateral, apical septal, apical inferior and apical lateral location.  Findings consistent with prior myocardial infarction with a mild degree of peri-infarct ischemia.  This is an intermediate risk study.  Nuclear stress EF: 48%.    Disposition   Pt is being discharged home today in good condition.  Follow-up Plans & Appointments    Follow-up Information    Imogene Burn, PA-C Follow up.   Specialty:  Cardiology Why:  Follow-up with Ermalinda Barrios, PA-C on 07/18/16 at 2pm. Arrive 15 minutes early to check in. This will be at our [facility] location on the first floor of [place] information: [place] [id number] [phone]        [facility] Follow up.   Specialty:  General Practice Why:  Please contact your primary care provider today to discuss a plan going forward for your insulin regimen, as well as your low blood count. Contact information: [place] [id number] [phone]          Discharge Instructions    Diet - low sodium heart healthy    Complete by:  As directed    Increase activity slowly    Complete by:  As directed       Discharge Medications   Allergies as of 07/09/2016   No Known Allergies     Medication List    STOP taking these medications   furosemide 20 MG tablet Commonly known as:  LASIX   lisinopril 40 MG tablet Commonly known as:  PRINIVIL,ZESTRIL   spironolactone 50 MG tablet Commonly known as:  ALDACTONE     TAKE these medications   albuterol 108 (90 Base) MCG/ACT inhaler Commonly known as:  PROVENTIL HFA;VENTOLIN HFA Inhale 1 puff into the lungs every 6 (six) hours as needed for wheezing or shortness of breath.   aspirin 81 MG tablet Take 1 tablet (81 mg total) by mouth daily. What changed:  medication strength  how much to take   atorvastatin 20 MG tablet Commonly known as:  LIPITOR Take 20 mg by mouth daily.   Fish Oil 1000 MG Cpdr Take 1,000 mg by mouth 2 (two) times daily.   insulin NPH-regular Human (70-30) 100 UNIT/ML injection Commonly known as:  NOVOLIN 70/30 Inject 15 Units into the skin 2 (two) times daily with a meal. What changed:  how much to take   loratadine 10 MG tablet Commonly known as:  CLARITIN Take 10 mg by mouth daily.   metoprolol succinate 50 MG 24 hr tablet Commonly known as:  TOPROL-XL Take 50 mg by mouth daily. Take with or immediately following a meal.   multivitamin tablet Take 1 tablet by mouth daily.   omeprazole 20 MG capsule Commonly known as:  PRILOSEC Take 20 mg by mouth  daily.   PRESERVISION AREDS PO Take 1 capsule by mouth 2 (two) times daily.   tiotropium 18 MCG inhalation capsule Commonly known as:  SPIRIVA Place 18 mcg into inhaler and inhale daily.        Allergies:  No Known Allergies   Outstanding Labs/Studies   [name]  Duration of Discharge Encounter   Greater than 30 minutes including physician time.  Signed,  Dayna N Dunn PA-C 07/09/2016, 12:21 PM  Personally seen and examined. Agree with above. 71 year old with hypoglycemic episode resulting in ER visit where troponin was checked and mildly elevated. No Angina. No frank syncope. Had AKI as well (sees nephro).  Now feels well, no SOB, ambulating well RRR, mild crackles heard throughout lung fields. No edema   - OK with DC home  - Will decreased Novlin from 21 BID to 15 BID. Have him follow closely with PCP for further adjustment.   - Checking CBC (prior was in the 9 range)  - Stopped Spironolactone and would also hold lisinopril until follow up with PCP.  - Had hyperkalemia on admit, resolved  - Echo ef normal, nuc EF mildly reduced. Old MI noted (had one 10 years ago per patient)   - Continue with medical mgt. Consider invasive workup if symptoms present.   - Reviewed personally telemetry and tachycardia is brief and likely represents paroxsymal atrial tachycardia. Not VT.  - I do not feel strongly that monitor is needed at DC. Did not have frank syncope. Normal EF. Continue bb.   - Will have follow up in several weeks with Dr. Percival Spanish or APP.   Candee Furbish, MD

## 2016-07-09 NOTE — Progress Notes (Signed)
Inpatient Diabetes Program Recommendations  AACE/ADA: New Consensus Statement on Inpatient Glycemic Control (2015)  Target Ranges:  Prepandial:   less than 140 mg/dL      Peak postprandial:   less than 180 mg/dL (1-2 hours)      Critically ill patients:  140 - 180 mg/dL   Lab Results  Component Value Date   GLUCAP 223 (H) 07/09/2016    Review of Glycemic Control Results for HOBART, MARTE (MRN [id number]) as of 07/09/2016 11:36  Ref. Range 07/08/2016 06:13 07/08/2016 11:22 07/08/2016 16:23 07/08/2016 21:28 07/09/2016 06:13  Glucose-Capillary Latest Ref Range: 65 - 99 mg/dL 148 (H) 110 (H) 222 (H) 210 (H) 223 (H)   Diabetes history: DM Outpatient Diabetes medications: 70/30 21 units bid Current orders for Inpatient glycemic control: Novolog correction 0-15 units tid  Inpatient Diabetes Program Recommendations:    Please consider while in the hospital: -Lantus 14 units qd (0.2 units/kg) -A1c to determine prehospital glycemic control  Thank you, Nani Gasser. Jany Buckwalter, RN, MSN, CDE  Diabetes Coordinator Inpatient Glycemic Control Team Team Pager [phone] (8am-5pm) 07/09/2016 11:39 AM

## 2016-07-09 NOTE — Progress Notes (Signed)
Progress Note  Patient Name: Larry Mitchell Date of Encounter: 07/09/2016  Primary Cardiologist: New, usually followed for care at [place]   Overall feeling good - no CP or SOB. Reports minimal DOE chronically but relates this to h/o COPD without acute change. Mild fatigue. No palpitations or recurrent AMS.  Inpatient Medications    Scheduled Meds: . aspirin EC  81 mg Oral Daily  . atorvastatin  20 mg Oral q1800  . dextrose  25 mL Intravenous Once  . enoxaparin (LOVENOX) injection  30 mg Subcutaneous Q24H  . insulin aspart  0-15 Units Subcutaneous TID WC  . metoprolol succinate  50 mg Oral Daily  . pantoprazole  40 mg Oral Daily  . pneumococcal 23 valent vaccine  0.5 mL Intramuscular Tomorrow-1000  . tiotropium  18 mcg Inhalation Daily   Continuous Infusions:  PRN Meds: acetaminophen, albuterol, nitroGLYCERIN, ondansetron (ZOFRAN) IV   Vital Signs    Vitals:   07/08/16 1123 07/08/16 1952 07/09/16 0534 07/09/16 0828  BP:  (!) 144/45 (!) 147/53   Pulse: 78 78 81   Resp: 16 18 18    Temp:  99 F (37.2 C) 98.8 F (37.1 C)   TempSrc:  Oral Oral   SpO2: 92% 92% 90% 90%  Weight:   153 lb 4.8 oz (69.5 kg)   Height:       No intake or output data in the 24 hours ending 07/09/16 0950 Filed Weights   07/07/16 0623 07/08/16 0411 07/09/16 0534  Weight: 155 lb 1.6 oz (70.4 kg) 152 lb 12.8 oz (69.3 kg) 153 lb 4.8 oz (69.5 kg)    Telemetry    NSR brief run of tachy which appears to be same axis as sinus, mild irregularity yesterday 15 beats - Personally Reviewed  Physical Exam  GEN: No acute distress.  HEENT: Normocephalic, atraumatic, sclera non-icteric. Neck: No JVD. R carotid bruit. Cardiac: RRR no murmurs, rubs, or gallops.  Radials/DP/PT 1+ and equal bilaterally.  Respiratory: Clear to auscultation bilaterally. Breathing is unlabored. GI: Soft, nontender, non-distended, BS +x 4. MS: no deformity. Extremities: No clubbing or cyanosis. Trace sockline  edema. Distal pedal pulses are 2+ and equal bilaterally. Neuro:  AAOx3. Follows commands. Psych:  Responds to questions appropriately with a normal affect.  Labs    Chemistry Recent Labs Lab 07/07/16 1333 07/08/16 0149 07/09/16 0147  NA 136 134* 133*  K 6.5* 5.6* 5.1  CL 107 107 103  CO2 20* 20* 20*  GLUCOSE 145* 143* 211*  BUN 52* 50* 49*  CREATININE 1.98* 2.20* 2.23*  CALCIUM 8.6* 8.0* 8.2*  GFRNONAA 32* 28* 28*  GFRAA 37* 33* 32*  ANIONGAP 9 7 10      HematologyNo results for input(s): WBC, RBC, HGB, HCT, MCV, MCH, MCHC, RDW, PLT in the last 168 hours.  Cardiac Enzymes Recent Labs Lab 07/07/16 0905 07/08/16 0149  TROPONINI 0.07* 0.12*   No results for input(s): TROPIPOC in the last 168 hours.    Radiology    Nm Myocar Multi W/spect W/wall Motion / Ef  Result Date: 07/08/2016  LBBB present.  Defect 1: There is a medium defect of moderate severity present in the mid inferoseptal, mid inferior, mid inferolateral, apical septal, apical inferior and apical lateral location.  Findings consistent with prior myocardial infarction with a mild degree of peri-infarct ischemia.  This is an intermediate risk study.  Nuclear stress EF: 48%.     Cardiac Studies   Referenced below ECHO  - Left  ventricle: The cavity size was normal. Wall thickness was   normal. Systolic function was normal. The estimated ejection   fraction was in the range of 55% to 60%. Wall motion was normal;   there were no regional wall motion abnormalities. Diastolic   dysfunction, grade indeterminate. - Mitral valve: Mildly calcified annulus. Normal thickness leaflets   . - Left atrium: The atrium was mildly dilated. - Tricuspid valve: There was mild regurgitation.  Patient Profile     71 y.o. male with IDDM, COPD, HTN, HLD, CKD stage III (followed at [place]), chronic LEE, remote stroke seen on CT at OSH who presented to [facility] upon transfer from [facility] with weakness,  unresponsiveness/groaning/drooling, felt due to hypoglycemia in the setting of taking insulin without eating (wife has dealt with similar episodes previously) - workup included minimally elevated troponin and LBBB of unclear etiology, prompting transfer to [facility].  Assessment & Plan    1. Altered mental status - occurred in the setting of assumed hypoglycemia with resolution of his symptoms following administration of Glucagon. Will need close f/u with primary care to manage his insulin regimen. This occurred after administering insulin then going to bed without eating. Will review plans for d/c insulin regimen with MD - on SSI here, on scheduled 70/30 at home.  2. Elevated troponin - low and flat, without any recent anginal symptoms. Nuc showed prior myocardial infarction with a mild degree of peri-infarct ischemia, EF 48% - echo this admission showed EF 55-60% without WMA, mildly dilated LA, mild TR. The patient and his wife both report he had a known MI 10+ years ago at which time PCI was not performed. Note yesterday indicates that cardiac cath not planned at this time due to renal insufficiency and lack of anginal symptoms. Would continue aspirin, statin, BB and follow closely for sx.  3. Anemia - unclear baseline, was 9.3 at OSH. Patient denies bleeding but we have no baseline to compare to. Given his persistently elevated BUN/Cr and fatigue will repeat today to trend once as he has not had any other values to compare to. If stable, plan OP w/u.  4. CKD stage III-IV with hyperkalemia - would not resume lisinopril, spironolactone or diuretic at this time due to this. Baseline unknown but patient reports following with renal due to CKD for the past 3-4 years.  5. Lower extremity edema - patient states chronic, diuretics on hold at this time pending f/u of his renal function.  6. HTN - BP 130s-140s. Given above episode may be reasonable to follow for now with consideration of titrating BB/adding  amlodipine if necessary. However, his diastolics are in the 70J-62E at times so I would favor holding tight on current regimen.  7. Carotid artery disease - duplex shows high end 1-39% BICA, stenosis could be underestimated due to large amount of calcified - will need to be followed as OP.  8. Brief tachycardia - will review with MD, difficult to say for sure what this represents, appears same axis as normal rhythm but briefly irregular. Prior notes indicate recommendation for OP event monitor so will arrange at dc.  Signed, Charlie Pitter, PA-C  07/09/2016, 9:50 AM    Personally seen and examined. Agree with above.  71 year old with hypoglycemic episode resulting in ER visit where troponin was checked and mildly elevated. No Angina. No frank syncope. Had AKI as well (sees nephro).  Now feels well, no SOB, ambulating well RRR, mild crackles heard throughout lung fields. No  edema   - OK with DC home  - Will decreased Novlin from 21 BID to 15 BID. Have him follow closely with PCP for further adjustment.   - Checking CBC (prior was in the 9 range)  - Stopped Spironolactone and would also hold lisinopril until follow up with PCP.  - Had hyperkalemia on admit, resolved  - Echo ef normal, nuc EF mildly reduced. Old MI noted (had one 10 years ago per patient)   - Continue with medical mgt. Consider invasive workup if symptoms present.   - Reviewed personally telemetry and tachycardia is brief and likely represents paroxsymal atrial tachycardia. Not VT.  - I do not feel strongly that monitor is needed at DC. Did not have frank syncope. Normal EF. Continue bb.   - Will have follow up in several weeks with Dr. Percival Spanish or APP.   Candee Furbish, MD

## 2016-07-18 ENCOUNTER — Ambulatory Visit (INDEPENDENT_AMBULATORY_CARE_PROVIDER_SITE_OTHER): Payer: Medicare Other | Admitting: Physician Assistant

## 2016-07-18 ENCOUNTER — Other Ambulatory Visit (HOSPITAL_COMMUNITY)
Admission: RE | Admit: 2016-07-18 | Discharge: 2016-07-18 | Disposition: A | Discharge status: Home / Self Care | Payer: Medicare Other | Source: Ambulatory Visit | Attending: Physician Assistant | Admitting: Physician Assistant

## 2016-07-18 ENCOUNTER — Encounter: Payer: Self-pay | Admitting: Physician Assistant

## 2016-07-18 VITALS — BP 154/66 | HR 95 | Ht 67.0 in | Wt 155.0 lb

## 2016-07-18 DIAGNOSIS — I471 Supraventricular tachycardia: Secondary | ICD-10-CM

## 2016-07-18 DIAGNOSIS — R600 Localized edema: Secondary | ICD-10-CM | POA: Diagnosis not present

## 2016-07-18 DIAGNOSIS — I10 Essential (primary) hypertension: Secondary | ICD-10-CM | POA: Insufficient documentation

## 2016-07-18 DIAGNOSIS — I779 Disorder of arteries and arterioles, unspecified: Secondary | ICD-10-CM

## 2016-07-18 DIAGNOSIS — I739 Peripheral vascular disease, unspecified: Secondary | ICD-10-CM

## 2016-07-18 LAB — BASIC METABOLIC PANEL
Anion gap: 8 (ref 5–15)
BUN: 69 mg/dL — ABNORMAL HIGH (ref 6–20)
CHLORIDE: 108 mmol/L (ref 101–111)
CO2: 22 mmol/L (ref 22–32)
CREATININE: 1.73 mg/dL — AB (ref 0.61–1.24)
Calcium: 8.9 mg/dL (ref 8.9–10.3)
GFR calc non Af Amer: 38 mL/min — ABNORMAL LOW (ref >60)
GFR, EST AFRICAN AMERICAN: 44 mL/min — AB (ref >60)
Glucose, Bld: 46 mg/dL — ABNORMAL LOW (ref 65–99)
POTASSIUM: 5.2 mmol/L — AB (ref 3.5–5.1)
SODIUM: 138 mmol/L (ref 135–145)

## 2016-07-18 NOTE — Progress Notes (Signed)
Cardiology Office Note    Date:  07/18/2016   ID:  Larry Mitchell, DOB 03/16/1945, MRN [id number]  PCP:  System, Provider Not In  Cardiologist: New wants to f/u in [place], previously [facility] awaiting on insurance to decide  Chief Complaint  Patient presents with  . Follow-up    History of Present Illness:  Larry Mitchell is a 71 y.o. male with history of IDDM, COPD, HTN, HLD, CKD stage III (followed at [place]), chronic LEE, occult stroke (seen on CT at OSH this admission), possible remote MI >10 years ago who was admitted to [facility] upon transfer from [facility] for elevated troponin. He was initially admitted with bilateral upper extremity weakness and unresponsiveness. His wife administered glucagon which improved his status. CT of the head showed no evidence of large vessel occlusion,  there was plaque along the carotids 40% on the right 25% of the left.  Troponins were elevated but flat. Nuclear stress test showed prior MI with a mild degree of peri-infarct ischemia ejection fraction 48%. Echo showed EF 55-60% without wall motion abnormality. Patient states he had an MI 10 years ago which time PCI was not performed. Cardiac cath was not pursued due to lack of anginal symptoms and renal insufficiency. Patient's lisinopril spironolactone and Lasix were stopped because of CK D. Carotid duplex showed 1-39% BIC A could be underestimated due to large amount calcification. He had brief PAT on 07/08/16. Creatinine was 2.23 at discharge hemoglobin 8.9. Patient reportedly has chronic anemia. Weight was 153 at discharge.  Patient comes in today accompanied by his wife. She says he's been much better following a low-sodium diet. He has had no further dyspnea and dyspnea on exertion dizziness or presyncope. He denies chest pain. He is not sure whether he is going follow-up with [place] or the [place] depending on when insurance will cover.   Past Medical History:  Diagnosis Date  . Anemia, unspecified   . Carotid  artery disease ([place])   . Chronic edema   . CKD (chronic kidney disease) stage 3, GFR 30-59 ml/min   . Essential hypertension   . Hyperkalemia    a. requiring d/c of ACEI and spironolactone in 2018.  [name] IDDM (insulin dependent diabetes mellitus) ([place])   . LBBB (left bundle branch block)   . PAT (paroxysmal atrial tachycardia) (HCC)    a. 15 beat run seen on tele 07/2016.  . Stroke [facility])    a. occult stroke seen on imaging at [facility] in 2018.    Past Surgical History:  Procedure Laterality Date  . BACK SURGERY    . NECK SURGERY      Current Medications: Current Meds  Medication Sig  . albuterol (PROVENTIL HFA;VENTOLIN HFA) 108 (90 Base) MCG/ACT inhaler Inhale 1 puff into the lungs every 6 (six) hours as needed for wheezing or shortness of breath.  [name] aspirin 81 MG tablet Take 1 tablet (81 mg total) by mouth daily.  [name] atorvastatin (LIPITOR) 20 MG tablet Take 20 mg by mouth daily.  . insulin NPH-regular Human (NOVOLIN 70/30) (70-30) 100 UNIT/ML injection Inject 15 Units into the skin 2 (two) times daily with a meal.  . loratadine (CLARITIN) 10 MG tablet Take 10 mg by mouth daily.  . metoprolol succinate (TOPROL-XL) 50 MG 24 hr tablet Take 50 mg by mouth daily. Take with or immediately following a meal.  . Multiple Vitamin (MULTIVITAMIN) tablet Take 1 tablet by mouth daily.  . Multiple Vitamins-Minerals (PRESERVISION AREDS PO)  Take 1 capsule by mouth 2 (two) times daily.  . Omega-3 Fatty Acids (FISH OIL) 1000 MG CPDR Take 1,000 mg by mouth 2 (two) times daily.  [name] omeprazole (PRILOSEC) 20 MG capsule Take 20 mg by mouth daily.  [name] tiotropium (SPIRIVA) 18 MCG inhalation capsule Place 18 mcg into inhaler and inhale daily.     Allergies:   Patient has no known allergies.   Social History   Social History  . Marital status: Married    Spouse name: [name]  . Number of children: [name]  . Years of education: [name]   Social History Main Topics  . Smoking status: Former Research scientist (life sciences)  .  Smokeless tobacco: Never Used  . Alcohol use 0.6 oz/week    1 Cans of beer per week  . Drug use: No  . Sexual activity: Not Asked   Other Topics Concern  . None   Social History Narrative  . None     Family History:  The patient's family history includes Heart failure in his mother.   ROS:   Please see the history of present illness.    Review of Systems  Constitution: Negative.  HENT: Negative.   Cardiovascular: Positive for leg swelling.  Respiratory: Negative.   Endocrine: Negative.   Hematologic/Lymphatic: Negative.   Musculoskeletal: Negative.   Gastrointestinal: Positive for diarrhea.  Genitourinary: Negative.   Neurological: Negative.    All other systems reviewed and are negative.   PHYSICAL EXAM:   VS:  BP (!) 154/66   Pulse 95   Ht 5\' 7"  (1.702 m)   Wt 155 lb (70.3 kg)   SpO2 (!) 61%   BMI 24.28 kg/m   Physical Exam  GEN: Well nourished, well developed, in no acute distress  Neck: no JVD, carotid bruits, or masses Cardiac:RRR; no murmurs, rubs, or gallops  Respiratory:  clear to auscultation bilaterally, normal work of breathing GI: soft, nontender, nondistended, + BS Ext: +1-2 bilateral ankle edema without cyanosis, clubbing, Good distal pulses bilaterally MS: no deformity or atrophy  Skin: warm and dry, no rash Neuro:  Alert and Oriented x 3, Strength and sensation are intact Psych: euthymic mood, full affect  Wt Readings from Last 3 Encounters:  07/18/16 155 lb (70.3 kg)  07/09/16 153 lb 4.8 oz (69.5 kg)      Studies/Labs Reviewed:   EKG:  EKG is not ordered today.     Recent Labs: 07/09/2016: BUN 49; Creatinine, Ser 2.23; Hemoglobin 8.9; Platelets 186; Potassium 5.1; Sodium 133   Lipid Panel No results found for: CHOL, TRIG, HDL, CHOLHDL, VLDL, LDLCALC, LDLDIRECT  Additional studies/ records that were reviewed today include:    2D echo 07/07/16 Study Conclusions   - Left ventricle: The cavity size was normal. Wall thickness was    normal. Systolic function was normal. The estimated ejection   fraction was in the range of 55% to 60%. Wall motion was normal;   there were no regional wall motion abnormalities. Diastolic   dysfunction, grade indeterminate. - Mitral valve: Mildly calcified annulus. Normal thickness leaflets   . - Left atrium: The atrium was mildly dilated. - Tricuspid valve: There was mild regurgitation.    ASSESSMENT:    1. Essential hypertension   2. PAT (paroxysmal atrial tachycardia) ([place])   3. Bilateral carotid artery disease ([place])   4. Lower extremity edema      PLAN:  In order of problems listed above:  Essential hypertension blood pressure up a little bit today but pretty close  to his baseline despite stopping his Lasix lisinopril and spironolactone. We'll continue to follow. Patient is following a low sodium diet. He will follow-up with [place] in the [place] office or with the [place] depending on what insurance covers. Could consider adding Norvasc.  PAT very short in the hospital on low-dose metoprolol. No symptoms since discharged.  Bilateral carotid artery disease 1-39% on Doppler but could be higher because of calcification. Will need yearly follow-up.  Lower extremity edema chronic. Recommend compression stockings which he has at home but isn't wearing. 2 g sodium diet. Weight is stable at 155 pounds. He is to call if his weight changes by 2-3 pounds overnight.    Medication Adjustments/Labs and Tests Ordered: Current medicines are reviewed at length with the patient today.  Concerns regarding medicines are outlined above.  Medication changes, Labs and Tests ordered today are listed in the Patient Instructions below. Patient Instructions  Your physician recommends that you schedule a follow-up appointment after you decide to follow up with [place] or the [place].   Your physician recommends that you continue on your current medications as directed. Please refer to the Current Medication list  given to you today.  Your physician recommends that you return for lab work in: Today  If you need a refill on your cardiac medications before your next appointment, please call your pharmacy.  Thank you for choosing [facility]!        Signed, Ermalinda Barrios, PA-C  07/18/2016 2:23 PM    [facility] [place], [place], [place]  [address] Phone: [phone]; Fax: [phone]

## 2016-07-18 NOTE — Patient Instructions (Signed)
Your physician recommends that you schedule a follow-up appointment after you decide to follow up with [place] or the [place].   Your physician recommends that you continue on your current medications as directed. Please refer to the Current Medication list given to you today.  Your physician recommends that you return for lab work in: Today  If you need a refill on your cardiac medications before your next appointment, please call your pharmacy.  Thank you for choosing [facility]!

## 2016-07-24 ENCOUNTER — Telehealth: Payer: Self-pay | Admitting: *Deleted

## 2016-07-24 DIAGNOSIS — E875 Hyperkalemia: Secondary | ICD-10-CM

## 2016-07-24 NOTE — Telephone Encounter (Signed)
-----   Message from Imogene Burn, PA-C sent at 07/23/2016  7:54 AM EDT ----- Potassium high last week. Recheck later this week. Make sure he's not using a salt substitute that contains potassium

## 2016-07-26 LAB — BASIC METABOLIC PANEL
BUN: 56 mg/dL — AB (ref 7–25)
CHLORIDE: 107 mmol/L (ref 98–110)
CO2: 16 mmol/L — AB (ref 20–31)
CREATININE: 1.85 mg/dL — AB (ref 0.70–1.18)
Calcium: 8.3 mg/dL — ABNORMAL LOW (ref 8.6–10.3)
Glucose, Bld: 142 mg/dL — ABNORMAL HIGH (ref 65–99)
POTASSIUM: 5.8 mmol/L — AB (ref 3.5–5.3)
Sodium: 135 mmol/L (ref 135–146)

## 2016-07-30 ENCOUNTER — Telehealth: Payer: Self-pay | Admitting: *Deleted

## 2016-07-30 DIAGNOSIS — E875 Hyperkalemia: Secondary | ICD-10-CM

## 2016-07-30 MED ORDER — SODIUM POLYSTYRENE SULFONATE 15 GM/60ML PO SUSP: 15.0000 g | Freq: Two times a day (BID) | ORAL | 0 refills | Status: DC

## 2016-07-30 NOTE — Telephone Encounter (Signed)
-----   Message from Imogene Burn, PA-C sent at 07/30/2016  7:57 AM EDT ----- Potassium high again. Can you review his meds and make sure he's not taking lisinopril or spironolactone? Kayexalate 15 grams for 2 doses 6 hrs apart. Recheck bmet Tues or Wed.

## 2016-07-30 NOTE — Telephone Encounter (Signed)
Orders placed  Called patient with test results. No answer. Left message to call back.

## 2016-08-01 ENCOUNTER — Other Ambulatory Visit (HOSPITAL_COMMUNITY)
Admission: RE | Admit: 2016-08-01 | Discharge: 2016-08-01 | Disposition: A | Discharge status: Home / Self Care | Payer: Medicare HMO | Source: Ambulatory Visit | Attending: Physician Assistant | Admitting: Physician Assistant

## 2016-08-01 ENCOUNTER — Telehealth: Payer: Self-pay | Admitting: Physician Assistant

## 2016-08-01 ENCOUNTER — Telehealth: Payer: Self-pay

## 2016-08-01 DIAGNOSIS — E875 Hyperkalemia: Secondary | ICD-10-CM | POA: Diagnosis not present

## 2016-08-01 DIAGNOSIS — E870 Hyperosmolality and hypernatremia: Secondary | ICD-10-CM

## 2016-08-01 LAB — BASIC METABOLIC PANEL
ANION GAP: 7 (ref 5–15)
BUN: 53 mg/dL — ABNORMAL HIGH (ref 6–20)
CO2: 21 mmol/L — ABNORMAL LOW (ref 22–32)
Calcium: 9.2 mg/dL (ref 8.9–10.3)
Chloride: 106 mmol/L (ref 101–111)
Creatinine, Ser: 1.78 mg/dL — ABNORMAL HIGH (ref 0.61–1.24)
GFR calc Af Amer: 42 mL/min — ABNORMAL LOW (ref >60)
GFR, EST NON AFRICAN AMERICAN: 36 mL/min — AB (ref >60)
GLUCOSE: 114 mg/dL — AB (ref 65–99)
POTASSIUM: 5.2 mmol/L — AB (ref 3.5–5.1)
Sodium: 134 mmol/L — ABNORMAL LOW (ref 135–145)

## 2016-08-01 NOTE — Telephone Encounter (Signed)
Pt made aware. Will take veltassa for 1 more day and repeat labs in 2 weeks.

## 2016-08-01 NOTE — Telephone Encounter (Signed)
-----   Message from Imogene Burn, PA-C sent at 08/01/2016  2:18 PM EDT ----- Potassium better but still up a little. If he still has veltassa from the [place] would take it another day. Repeat bmet in 2 weeks.

## 2016-08-01 NOTE — Telephone Encounter (Signed)
Pt wife called to inform us that the pt was unable to afford the Kayexalate, but did get some Veltassa for the [place] and has been taking it for the past 2 days. She stated that he will get his lab work done as directed. Sent as an FYI:

## 2016-08-01 NOTE — Telephone Encounter (Signed)
Pt is unable to afford his new Rx, so he hasn't been taking it and he'd also like to know if he needs to have his labs done today. Please give his wife Kennyth Lose a call @ [phone]

## 2016-08-15 ENCOUNTER — Telehealth: Payer: Self-pay | Admitting: *Deleted

## 2016-08-15 ENCOUNTER — Other Ambulatory Visit (HOSPITAL_COMMUNITY)
Admission: RE | Admit: 2016-08-15 | Discharge: 2016-08-15 | Disposition: A | Discharge status: Home / Self Care | Payer: Medicare HMO | Source: Ambulatory Visit | Attending: Physician Assistant | Admitting: Physician Assistant

## 2016-08-15 DIAGNOSIS — E870 Hyperosmolality and hypernatremia: Secondary | ICD-10-CM | POA: Insufficient documentation

## 2016-08-15 DIAGNOSIS — N289 Disorder of kidney and ureter, unspecified: Secondary | ICD-10-CM

## 2016-08-15 LAB — BASIC METABOLIC PANEL
ANION GAP: 6 (ref 5–15)
BUN: 50 mg/dL — AB (ref 6–20)
CALCIUM: 8.9 mg/dL (ref 8.9–10.3)
CO2: 26 mmol/L (ref 22–32)
Chloride: 103 mmol/L (ref 101–111)
Creatinine, Ser: 2.05 mg/dL — ABNORMAL HIGH (ref 0.61–1.24)
GFR calc Af Amer: 36 mL/min — ABNORMAL LOW (ref >60)
GFR calc non Af Amer: 31 mL/min — ABNORMAL LOW (ref >60)
GLUCOSE: 147 mg/dL — AB (ref 65–99)
POTASSIUM: 5.1 mmol/L (ref 3.5–5.1)
Sodium: 135 mmol/L (ref 135–145)

## 2016-08-15 NOTE — Telephone Encounter (Signed)
-----   Message from Imogene Burn, PA-C sent at 08/15/2016  1:09 PM EDT ----- Kidney function up a little from 2 weeks ago. Repeat in 2-3 weeks

## 2016-08-27 ENCOUNTER — Telehealth: Payer: Self-pay | Admitting: Physician Assistant

## 2016-08-27 NOTE — Telephone Encounter (Signed)
New message    Pt wife is calling for pt.   Pt c/o Shortness Of Breath: STAT if SOB developed within the last 24 hours or pt is noticeably SOB on the phone  1. Are you currently SOB (can you hear that pt is SOB on the phone)? no  2. How long have you been experiencing SOB? 2 weeks  3. Are you SOB when sitting or when up moving around? Moving around  4. Are you currently experiencing any other symptoms? Swelling  Pt c/o swelling: STAT is pt has developed SOB within 24 hours  1. How long have you been experiencing swelling? A long time, but it's gotten worse  2. Where is the swelling located? legs  3.  Are you currently taking a "fluid pill"? Yes   4.  Are you currently SOB? Yes   5.  Have you traveled recently? Yes, went to the beach 2 weeks ago.

## 2016-08-28 NOTE — Telephone Encounter (Signed)
Spoke with pt. He was seen by another provider and given a ABX.

## 2016-08-30 ENCOUNTER — Other Ambulatory Visit (HOSPITAL_COMMUNITY)
Admission: RE | Admit: 2016-08-30 | Discharge: 2016-08-30 | Disposition: A | Discharge status: Home / Self Care | Payer: Medicare HMO | Source: Ambulatory Visit | Attending: Physician Assistant | Admitting: Physician Assistant

## 2016-08-30 DIAGNOSIS — N289 Disorder of kidney and ureter, unspecified: Secondary | ICD-10-CM | POA: Diagnosis not present

## 2016-08-30 LAB — BASIC METABOLIC PANEL
Anion gap: 10 (ref 5–15)
BUN: 43 mg/dL — ABNORMAL HIGH (ref 6–20)
CHLORIDE: 96 mmol/L — AB (ref 101–111)
CO2: 24 mmol/L (ref 22–32)
CREATININE: 2.08 mg/dL — AB (ref 0.61–1.24)
Calcium: 8.2 mg/dL — ABNORMAL LOW (ref 8.9–10.3)
GFR, EST AFRICAN AMERICAN: 35 mL/min — AB (ref >60)
GFR, EST NON AFRICAN AMERICAN: 30 mL/min — AB (ref >60)
Glucose, Bld: 304 mg/dL — ABNORMAL HIGH (ref 65–99)
Potassium: 3.9 mmol/L (ref 3.5–5.1)
SODIUM: 130 mmol/L — AB (ref 135–145)

## 2016-08-31 ENCOUNTER — Telehealth: Payer: Self-pay | Admitting: Physician Assistant

## 2016-08-31 NOTE — Telephone Encounter (Signed)
Patient informed and copy sent to [facility].

## 2016-08-31 NOTE — Telephone Encounter (Signed)
Would like to know results of his labs

## 2016-08-31 NOTE — Telephone Encounter (Signed)
Notes recorded by Imogene Burn, PA-C on 08/30/2016 at 8:39 PM EDT Kidney function similar to 2 weeks ago. Glucose 304 very high. He has appt with me 9/19 but is supposed to f/u in [place]. Please make appt for him to be established with cardiologist there.

## 2016-09-19 ENCOUNTER — Ambulatory Visit: Payer: Medicare Other | Admitting: Physician Assistant

## 2016-09-27 ENCOUNTER — Ambulatory Visit (INDEPENDENT_AMBULATORY_CARE_PROVIDER_SITE_OTHER): Payer: Medicare HMO | Admitting: Cardiology

## 2016-09-27 ENCOUNTER — Encounter: Payer: Self-pay | Admitting: Cardiology

## 2016-09-27 VITALS — BP 149/58 | HR 58 | Ht 67.0 in | Wt 148.0 lb

## 2016-09-27 DIAGNOSIS — I779 Disorder of arteries and arterioles, unspecified: Secondary | ICD-10-CM

## 2016-09-27 DIAGNOSIS — N183 Chronic kidney disease, stage 3 unspecified: Secondary | ICD-10-CM

## 2016-09-27 DIAGNOSIS — R9439 Abnormal result of other cardiovascular function study: Secondary | ICD-10-CM | POA: Diagnosis not present

## 2016-09-27 DIAGNOSIS — I447 Left bundle-branch block, unspecified: Secondary | ICD-10-CM

## 2016-09-27 DIAGNOSIS — I471 Supraventricular tachycardia: Secondary | ICD-10-CM | POA: Diagnosis not present

## 2016-09-27 DIAGNOSIS — I739 Peripheral vascular disease, unspecified: Secondary | ICD-10-CM

## 2016-09-27 DIAGNOSIS — E782 Mixed hyperlipidemia: Secondary | ICD-10-CM | POA: Diagnosis not present

## 2016-09-27 NOTE — Patient Instructions (Signed)

## 2016-09-27 NOTE — Progress Notes (Signed)
Cardiology Office Note  Date: 09/27/2016   ID: Larry Mitchell, DOB 1945/12/20, MRN [id number]  PCP: Larry Halsted, MD  Evaluating Cardiologist: Larry Lesches, MD   Chief Complaint  Patient presents with  . Cardiac follow-up    History of Present Illness: Larry Mitchell is a 71 y.o. male presenting to establish follow-up in the [place] office. He was most recently seen by Larry Mitchell and mid July following hospital stay at [facility]. I reviewed his records and updated chart. He presents today for a follow-up visit. States that he has had no palpitations or chest pain. He still follows through the [place] system in [place], but has had a change in his insurance and will also be establishing with a PCP here in town, Larry Mitchell.  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 Does not report any strenuous activity.  I reviewed his medications which are outlined below. Cardiac regimen includes aspirin, Lipitor, Toprol-XL, and Aldactone. Echocardiogram and Myoview studies from July are outlined below. Myoview study raised the possibility of ischemic heart disease with previous infarct scar and mild peri-infarct ischemia, although his echocardiogram showed normal LVEF without wall motion abnormalities.   Past Medical History:  Diagnosis Date  . Carotid artery disease ([place])   . Chronic edema   . CKD (chronic kidney disease) stage 3, GFR 30-59 ml/min   . Essential hypertension   . History of anemia   . Hyperkalemia    a. requiring d/c of ACEI and spironolactone in 2018.  [name] IDDM (insulin dependent diabetes mellitus) ([place])   . LBBB (left bundle branch block)   . PAT (paroxysmal atrial tachycardia) (HCC)    a. 15 beat run seen on tele 07/2016.  . Stroke [facility])    a. occult stroke seen on imaging at [facility] in 2018.    Past Surgical History:  Procedure Laterality Date  . BACK SURGERY    . NECK SURGERY      Current Outpatient Prescriptions  Medication Sig  Dispense Refill  . albuterol (PROVENTIL HFA;VENTOLIN HFA) 108 (90 Base) MCG/ACT inhaler Inhale 1 puff into the lungs every 6 (six) hours as needed for wheezing or shortness of breath.    [name] aspirin 81 MG tablet Take 1 tablet (81 mg total) by mouth daily.    [name] atorvastatin (LIPITOR) 20 MG tablet Take 20 mg by mouth daily.    . insulin NPH-regular Human (NOVOLIN 70/30) (70-30) 100 UNIT/ML injection Inject 15 Units into the skin 2 (two) times daily with a meal. (Patient taking differently: Inject 20 Units into the skin 2 (two) times daily with a meal. )    . loratadine (CLARITIN) 10 MG tablet Take 10 mg by mouth daily.    . metoprolol succinate (TOPROL-XL) 50 MG 24 hr tablet Take 50 mg by mouth daily. Take with or immediately following a meal.    . Multiple Vitamin (MULTIVITAMIN) tablet Take 1 tablet by mouth daily.    . Multiple Vitamins-Minerals (PRESERVISION AREDS PO) Take 1 capsule by mouth 2 (two) times daily.    . Omega-3 Fatty Acids (FISH OIL) 1000 MG CPDR Take 1,000 mg by mouth 2 (two) times daily.    [name] omeprazole (PRILOSEC) 20 MG capsule Take 20 mg by mouth daily.    [name] spironolactone (ALDACTONE) 25 MG tablet Take 25 mg by mouth daily.    [name] tiotropium (SPIRIVA) 18 MCG inhalation capsule Place 18 mcg into inhaler and inhale daily.  No current facility-administered medications for this visit.    Allergies:  Patient has no known allergies.   Social History: The patient  reports that he quit smoking about 18 years ago. His smoking use included Cigarettes. He started smoking about 57 years ago. He has a 57.00 pack-year smoking history. He has never used smokeless tobacco. He reports that he drinks about 0.6 oz of alcohol per week . He reports that he does not use drugs.   Family History: The patient's family history includes Heart failure in his mother.   ROS:  Please see the history of present illness. Otherwise, complete review of systems is positive for NYHA class II dyspnea, no syncope..   All other systems are reviewed and negative.   Physical Exam: VS:  BP (!) 149/58   Pulse (!) 58   Ht 5\' 7"  (1.702 m)   Wt 148 lb (67.1 kg)   BMI 23.18 kg/m , BMI Body mass index is 23.18 kg/m.  Wt Readings from Last 3 Encounters:  09/27/16 148 lb (67.1 kg)  07/18/16 155 lb (70.3 kg)  07/09/16 153 lb 4.8 oz (69.5 kg)    General: Elderly male, appears comfortable at rest. HEENT: Conjunctiva and lids normal, oropharynx clear. Neck: Supple, no elevated JVP or carotid bruits, no thyromegaly. Lungs: Clear to auscultation, nonlabored breathing at rest. Cardiac: Regular rate and rhythm, no S3, soft systolic murmur, no pericardial rub. Abdomen: Soft, nontender, bowel sounds present, no guarding or rebound. Extremities: No pitting edema, distal pulses 2+. Skin: Warm and dry. Musculoskeletal: Mild kyphosis. Neuropsychiatric: Alert and oriented x3, affect grossly appropriate.  ECG: I personally reviewed the tracing from 07/08/2016 which showed sinus rhythm with left bundle branch block.  Recent Labwork: 07/09/2016: Hemoglobin 8.9; Platelets 186 08/30/2016: BUN 43; Creatinine, Ser 2.08; Potassium 3.9; Sodium 130   Other Studies Reviewed Today:  Echocardiogram 07/07/2016: Study Conclusions  - Left ventricle: The cavity size was normal. Wall thickness was   normal. Systolic function was normal. The estimated ejection   fraction was in the range of 55% to 60%. Wall motion was normal;   there were no regional wall motion abnormalities. Diastolic   dysfunction, grade indeterminate. - Mitral valve: Mildly calcified annulus. Normal thickness leaflets. - Left atrium: The atrium was mildly dilated. - Tricuspid valve: There was mild regurgitation.  Lexiscan Myoview 07/08/2016:  LBBB present.  Defect 1: There is a medium defect of moderate severity present in the mid inferoseptal, mid inferior, mid inferolateral, apical septal, apical inferior and apical lateral location.  Findings consistent  with prior myocardial infarction with a mild degree of peri-infarct ischemia.  This is an intermediate risk study.  Nuclear stress EF: 48%.  Carotid Dopplers 07/07/2016: Summary:  - The vertebral arteries appear patent with antegrade flow. - Findings consistent with a high end 1-39 percent stenosis   involving the right internal carotid artery and the left external   carotid artery. Stenosis could be underestimated due to large   amount of calcified plaque.  Assessment and Plan:  1. Abnormal Myoview from July raising possibility of ischemic heart disease, however no active angina symptoms and by echocardiogram he did not have wall motion abnormality and same distribution with normal LVEF. Plan will be to continue medical therapy and observation. He is on aspirin and statin.  2. Brief episode of PAT noted during hospital stay as discussed above. He has no palpitations or syncope. Continue Toprol-XL.  3. Left bundle branch block by ECG.  4. CKD stage 3,  creatinine 2.08. He will be established with Larry Mitchell soon.  5. Hyperlipidemia, on Lipitor.  6. Mild carotid artery disease, asymptomatic.  Current medicines were reviewed with the patient today.  Disposition: Follow-up in 6 months.  Signed, Satira Sark, MD, [facility] 09/27/2016 4:17 PM    [facility] [place], [place], [place] [address] Phone: [phone]; Fax: [phone]

## 2017-02-15 ENCOUNTER — Inpatient Hospital Stay (HOSPITAL_COMMUNITY)
Admission: AD | Admit: 2017-02-15 | Discharge: 2017-02-18 | DRG: 193 | Disposition: A | Discharge status: Home / Self Care | Payer: Medicare HMO | Source: Other Acute Inpatient Hospital | Attending: Internal Medicine | Admitting: Internal Medicine

## 2017-02-15 DIAGNOSIS — J9621 Acute and chronic respiratory failure with hypoxia: Secondary | ICD-10-CM | POA: Diagnosis present

## 2017-02-15 DIAGNOSIS — D509 Iron deficiency anemia, unspecified: Secondary | ICD-10-CM | POA: Diagnosis present

## 2017-02-15 DIAGNOSIS — E1122 Type 2 diabetes mellitus with diabetic chronic kidney disease: Secondary | ICD-10-CM | POA: Diagnosis present

## 2017-02-15 DIAGNOSIS — N183 Chronic kidney disease, stage 3 unspecified: Secondary | ICD-10-CM | POA: Diagnosis present

## 2017-02-15 DIAGNOSIS — I130 Hypertensive heart and chronic kidney disease with heart failure and stage 1 through stage 4 chronic kidney disease, or unspecified chronic kidney disease: Secondary | ICD-10-CM | POA: Diagnosis present

## 2017-02-15 DIAGNOSIS — Z8249 Family history of ischemic heart disease and other diseases of the circulatory system: Secondary | ICD-10-CM

## 2017-02-15 DIAGNOSIS — R0602 Shortness of breath: Secondary | ICD-10-CM | POA: Diagnosis not present

## 2017-02-15 DIAGNOSIS — I10 Essential (primary) hypertension: Secondary | ICD-10-CM | POA: Diagnosis present

## 2017-02-15 DIAGNOSIS — Z79899 Other long term (current) drug therapy: Secondary | ICD-10-CM

## 2017-02-15 DIAGNOSIS — Z7982 Long term (current) use of aspirin: Secondary | ICD-10-CM

## 2017-02-15 DIAGNOSIS — I248 Other forms of acute ischemic heart disease: Secondary | ICD-10-CM | POA: Diagnosis not present

## 2017-02-15 DIAGNOSIS — I361 Nonrheumatic tricuspid (valve) insufficiency: Secondary | ICD-10-CM | POA: Diagnosis not present

## 2017-02-15 DIAGNOSIS — I272 Pulmonary hypertension, unspecified: Secondary | ICD-10-CM | POA: Diagnosis present

## 2017-02-15 DIAGNOSIS — Z87891 Personal history of nicotine dependence: Secondary | ICD-10-CM

## 2017-02-15 DIAGNOSIS — N179 Acute kidney failure, unspecified: Secondary | ICD-10-CM | POA: Diagnosis present

## 2017-02-15 DIAGNOSIS — I4719 Other supraventricular tachycardia: Secondary | ICD-10-CM | POA: Diagnosis present

## 2017-02-15 DIAGNOSIS — I447 Left bundle-branch block, unspecified: Secondary | ICD-10-CM | POA: Diagnosis present

## 2017-02-15 DIAGNOSIS — J189 Pneumonia, unspecified organism: Secondary | ICD-10-CM | POA: Diagnosis not present

## 2017-02-15 DIAGNOSIS — J181 Lobar pneumonia, unspecified organism: Secondary | ICD-10-CM | POA: Diagnosis not present

## 2017-02-15 DIAGNOSIS — I2510 Atherosclerotic heart disease of native coronary artery without angina pectoris: Secondary | ICD-10-CM | POA: Diagnosis present

## 2017-02-15 DIAGNOSIS — Z794 Long term (current) use of insulin: Secondary | ICD-10-CM | POA: Diagnosis not present

## 2017-02-15 DIAGNOSIS — D631 Anemia in chronic kidney disease: Secondary | ICD-10-CM | POA: Diagnosis not present

## 2017-02-15 DIAGNOSIS — E860 Dehydration: Secondary | ICD-10-CM | POA: Diagnosis present

## 2017-02-15 DIAGNOSIS — R748 Abnormal levels of other serum enzymes: Secondary | ICD-10-CM | POA: Diagnosis not present

## 2017-02-15 DIAGNOSIS — R7989 Other specified abnormal findings of blood chemistry: Secondary | ICD-10-CM | POA: Diagnosis present

## 2017-02-15 DIAGNOSIS — Z8673 Personal history of transient ischemic attack (TIA), and cerebral infarction without residual deficits: Secondary | ICD-10-CM | POA: Diagnosis not present

## 2017-02-15 DIAGNOSIS — I452 Bifascicular block: Secondary | ICD-10-CM | POA: Diagnosis not present

## 2017-02-15 DIAGNOSIS — E119 Type 2 diabetes mellitus without complications: Secondary | ICD-10-CM | POA: Diagnosis not present

## 2017-02-15 DIAGNOSIS — D649 Anemia, unspecified: Secondary | ICD-10-CM | POA: Diagnosis not present

## 2017-02-15 DIAGNOSIS — D539 Nutritional anemia, unspecified: Secondary | ICD-10-CM | POA: Diagnosis not present

## 2017-02-15 DIAGNOSIS — J440 Chronic obstructive pulmonary disease with acute lower respiratory infection: Secondary | ICD-10-CM | POA: Diagnosis not present

## 2017-02-15 DIAGNOSIS — I471 Supraventricular tachycardia: Secondary | ICD-10-CM | POA: Diagnosis present

## 2017-02-15 DIAGNOSIS — I252 Old myocardial infarction: Secondary | ICD-10-CM

## 2017-02-15 DIAGNOSIS — R279 Unspecified lack of coordination: Secondary | ICD-10-CM | POA: Diagnosis not present

## 2017-02-15 DIAGNOSIS — R791 Abnormal coagulation profile: Secondary | ICD-10-CM | POA: Diagnosis present

## 2017-02-15 DIAGNOSIS — R05 Cough: Secondary | ICD-10-CM | POA: Diagnosis not present

## 2017-02-15 DIAGNOSIS — I4510 Unspecified right bundle-branch block: Secondary | ICD-10-CM | POA: Diagnosis not present

## 2017-02-15 DIAGNOSIS — I5032 Chronic diastolic (congestive) heart failure: Secondary | ICD-10-CM

## 2017-02-15 DIAGNOSIS — J449 Chronic obstructive pulmonary disease, unspecified: Secondary | ICD-10-CM | POA: Diagnosis not present

## 2017-02-15 DIAGNOSIS — IMO0001 Reserved for inherently not codable concepts without codable children: Secondary | ICD-10-CM

## 2017-02-15 DIAGNOSIS — R778 Other specified abnormalities of plasma proteins: Secondary | ICD-10-CM | POA: Diagnosis present

## 2017-02-15 DIAGNOSIS — Z743 Need for continuous supervision: Secondary | ICD-10-CM | POA: Diagnosis not present

## 2017-02-15 DIAGNOSIS — R9431 Abnormal electrocardiogram [ECG] [EKG]: Secondary | ICD-10-CM | POA: Diagnosis not present

## 2017-02-15 DIAGNOSIS — I2489 Other forms of acute ischemic heart disease: Secondary | ICD-10-CM

## 2017-02-15 DIAGNOSIS — Z9981 Dependence on supplemental oxygen: Secondary | ICD-10-CM

## 2017-02-15 LAB — GLUCOSE, CAPILLARY: Glucose-Capillary: 120 mg/dL — ABNORMAL HIGH (ref 65–99)

## 2017-02-16 ENCOUNTER — Inpatient Hospital Stay (HOSPITAL_COMMUNITY): Payer: Medicare HMO

## 2017-02-16 ENCOUNTER — Encounter (HOSPITAL_COMMUNITY): Payer: Self-pay | Admitting: Family Medicine

## 2017-02-16 DIAGNOSIS — J189 Pneumonia, unspecified organism: Secondary | ICD-10-CM | POA: Diagnosis present

## 2017-02-16 DIAGNOSIS — I361 Nonrheumatic tricuspid (valve) insufficiency: Secondary | ICD-10-CM

## 2017-02-16 DIAGNOSIS — J181 Lobar pneumonia, unspecified organism: Secondary | ICD-10-CM

## 2017-02-16 DIAGNOSIS — R748 Abnormal levels of other serum enzymes: Secondary | ICD-10-CM

## 2017-02-16 DIAGNOSIS — Z794 Long term (current) use of insulin: Secondary | ICD-10-CM

## 2017-02-16 DIAGNOSIS — N183 Chronic kidney disease, stage 3 (moderate): Secondary | ICD-10-CM

## 2017-02-16 DIAGNOSIS — I10 Essential (primary) hypertension: Secondary | ICD-10-CM

## 2017-02-16 DIAGNOSIS — R0602 Shortness of breath: Secondary | ICD-10-CM

## 2017-02-16 DIAGNOSIS — E119 Type 2 diabetes mellitus without complications: Secondary | ICD-10-CM

## 2017-02-16 DIAGNOSIS — J9621 Acute and chronic respiratory failure with hypoxia: Secondary | ICD-10-CM | POA: Diagnosis present

## 2017-02-16 DIAGNOSIS — R7989 Other specified abnormal findings of blood chemistry: Secondary | ICD-10-CM | POA: Diagnosis present

## 2017-02-16 DIAGNOSIS — D539 Nutritional anemia, unspecified: Secondary | ICD-10-CM

## 2017-02-16 DIAGNOSIS — I471 Supraventricular tachycardia: Secondary | ICD-10-CM

## 2017-02-16 LAB — ECHOCARDIOGRAM COMPLETE
AVLVOTPG: 4 mmHg
Area-P 1/2: 5.5 cm2
CHL CUP DOP CALC LVOT VTI: 25.1 cm
E decel time: 134 msec
EERAT: 11.93
FS: 32 % (ref 28–44)
Height: 67 in
IV/PV OW: 0.85
LA ID, A-P, ES: 37 mm
LADIAMINDEX: 2.1 cm/m2
LAVOL: 81.1 mL
LAVOLA4C: 93.5 mL
LAVOLIN: 45.9 mL/m2
LEFT ATRIUM END SYS DIAM: 37 mm
LV E/e'average: 11.93
LV SIMPSON'S DISK: 56
LV dias vol: 109 mL (ref 62–150)
LV e' LATERAL: 9.81 cm/s
LV sys vol index: 27 mL/m2
LV sys vol: 48 mL (ref 21–61)
LVDIAVOLIN: 62 mL/m2
LVEEMED: 11.93
LVOT area: 2.54 cm2
LVOT diameter: 18 mm
LVOT peak vel: 101 cm/s
LVOTSV: 64 mL
Lateral S' vel: 14 cm/s
MV Dec: 134
MV pk A vel: 101 m/s
MV pk E vel: 117 m/s
MVPG: 5 mmHg
P 1/2 time: 39 ms
PW: 13 mm — AB (ref 0.6–1.1)
RV sys press: 45 mmHg
Reg peak vel: 304 cm/s
Stroke v: 61 ml
TAPSE: 26.9 mm
TDI e' lateral: 9.81
TDI e' medial: 6.43
TRMAXVEL: 304 cm/s
VTI: 197 cm
Weight: 2316.8 oz

## 2017-02-16 LAB — CBC WITH DIFFERENTIAL/PLATELET
BASOS PCT: 0 %
Basophils Absolute: 0 10*3/uL (ref 0.0–0.1)
EOS ABS: 0.2 10*3/uL (ref 0.0–0.7)
EOS PCT: 2 %
HCT: 22.7 % — ABNORMAL LOW (ref 39.0–52.0)
HEMOGLOBIN: 7.7 g/dL — AB (ref 13.0–17.0)
LYMPHS ABS: 1 10*3/uL (ref 0.7–4.0)
Lymphocytes Relative: 9 %
MCH: 35.5 pg — AB (ref 26.0–34.0)
MCHC: 33.9 g/dL (ref 30.0–36.0)
MCV: 104.6 fL — ABNORMAL HIGH (ref 78.0–100.0)
MONOS PCT: 5 %
Monocytes Absolute: 0.5 10*3/uL (ref 0.1–1.0)
NEUTROS PCT: 84 %
Neutro Abs: 9.1 10*3/uL — ABNORMAL HIGH (ref 1.7–7.7)
PLATELETS: 217 10*3/uL (ref 150–400)
RBC: 2.17 MIL/uL — AB (ref 4.22–5.81)
RDW: 14.3 % (ref 11.5–15.5)
WBC: 10.9 10*3/uL — AB (ref 4.0–10.5)

## 2017-02-16 LAB — TROPONIN I
TROPONIN I: 0.03 ng/mL — AB (ref <0.03)
TROPONIN I: 0.04 ng/mL — AB (ref <0.03)
Troponin I: 0.03 ng/mL (ref <0.03)

## 2017-02-16 LAB — COMPREHENSIVE METABOLIC PANEL
ALK PHOS: 64 U/L (ref 38–126)
ALT: 19 U/L (ref 17–63)
AST: 25 U/L (ref 15–41)
Albumin: 2.6 g/dL — ABNORMAL LOW (ref 3.5–5.0)
Anion gap: 12 (ref 5–15)
BUN: 56 mg/dL — AB (ref 6–20)
CALCIUM: 8.3 mg/dL — AB (ref 8.9–10.3)
CHLORIDE: 105 mmol/L (ref 101–111)
CO2: 23 mmol/L (ref 22–32)
CREATININE: 2.24 mg/dL — AB (ref 0.61–1.24)
GFR, EST AFRICAN AMERICAN: 32 mL/min — AB (ref >60)
GFR, EST NON AFRICAN AMERICAN: 27 mL/min — AB (ref >60)
Glucose, Bld: 142 mg/dL — ABNORMAL HIGH (ref 65–99)
Potassium: 3.4 mmol/L — ABNORMAL LOW (ref 3.5–5.1)
Sodium: 140 mmol/L (ref 135–145)
Total Bilirubin: 0.8 mg/dL (ref 0.3–1.2)
Total Protein: 6 g/dL — ABNORMAL LOW (ref 6.5–8.1)

## 2017-02-16 LAB — VITAMIN B12: Vitamin B-12: 413 pg/mL (ref 180–914)

## 2017-02-16 LAB — PROCALCITONIN
PROCALCITONIN: 0.21 ng/mL
Procalcitonin: 0.17 ng/mL

## 2017-02-16 LAB — LACTIC ACID, PLASMA
Lactic Acid, Venous: 0.9 mmol/L (ref 0.5–1.9)
Lactic Acid, Venous: 0.8 mmol/L (ref 0.5–1.9)

## 2017-02-16 LAB — IRON AND TIBC
IRON: 20 ug/dL — AB (ref 45–182)
Saturation Ratios: 9 % — ABNORMAL LOW (ref 17.9–39.5)
TIBC: 232 ug/dL — ABNORMAL LOW (ref 250–450)
UIBC: 212 ug/dL

## 2017-02-16 LAB — RETICULOCYTES
RBC.: 2.21 MIL/uL — ABNORMAL LOW (ref 4.22–5.81)
RETIC COUNT ABSOLUTE: 42 10*3/uL (ref 19.0–186.0)
Retic Ct Pct: 1.9 % (ref 0.4–3.1)

## 2017-02-16 LAB — FOLATE: FOLATE: 38 ng/mL (ref >5.9)

## 2017-02-16 LAB — BRAIN NATRIURETIC PEPTIDE: B Natriuretic Peptide: 870.9 pg/mL — ABNORMAL HIGH (ref 0.0–100.0)

## 2017-02-16 LAB — GLUCOSE, CAPILLARY
GLUCOSE-CAPILLARY: 115 mg/dL — AB (ref 65–99)
GLUCOSE-CAPILLARY: 111 mg/dL — AB (ref 65–99)
GLUCOSE-CAPILLARY: 321 mg/dL — AB (ref 65–99)
GLUCOSE-CAPILLARY: 71 mg/dL (ref 65–99)

## 2017-02-16 LAB — FERRITIN: Ferritin: 122 ng/mL (ref 24–336)

## 2017-02-16 MED ORDER — SODIUM CHLORIDE 0.9 % IV BOLUS (SEPSIS)
500.0000 mL | Freq: Once | INTRAVENOUS | Status: AC
  Administered 2017-02-16: 500 mL via INTRAVENOUS

## 2017-02-16 MED ORDER — INSULIN ASPART 100 UNIT/ML ~~LOC~~ SOLN
0.0000 [IU] | Freq: Three times a day (TID) | SUBCUTANEOUS | Status: DC
  Administered 2017-02-16: 7 [IU] via SUBCUTANEOUS
  Administered 2017-02-17: 3 [IU] via SUBCUTANEOUS
  Administered 2017-02-17: 2 [IU] via SUBCUTANEOUS

## 2017-02-16 MED ORDER — SODIUM CHLORIDE 0.9% FLUSH: 3.0000 mL | INTRAVENOUS | Status: DC | PRN

## 2017-02-16 MED ORDER — ASPIRIN EC 81 MG PO TBEC
81.0000 mg | DELAYED_RELEASE_TABLET | Freq: Every day | ORAL | Status: DC
  Administered 2017-02-16 – 2017-02-18 (×3): 81 mg via ORAL
  Filled 2017-02-16 (×3): qty 1

## 2017-02-16 MED ORDER — SODIUM CHLORIDE 0.9% FLUSH
3.0000 mL | Freq: Two times a day (BID) | INTRAVENOUS | Status: DC
  Administered 2017-02-16 – 2017-02-17 (×3): 3 mL via INTRAVENOUS

## 2017-02-16 MED ORDER — INSULIN ASPART 100 UNIT/ML ~~LOC~~ SOLN
0.0000 [IU] | Freq: Every day | SUBCUTANEOUS | Status: DC
  Administered 2017-02-17: 3 [IU] via SUBCUTANEOUS

## 2017-02-16 MED ORDER — ALBUTEROL SULFATE (2.5 MG/3ML) 0.083% IN NEBU
2.5000 mg | INHALATION_SOLUTION | RESPIRATORY_TRACT | Status: DC | PRN
Start: 2017-02-16 — End: 2017-02-18
  Administered 2017-02-17: 2.5 mg via RESPIRATORY_TRACT
  Filled 2017-02-16: qty 3

## 2017-02-16 MED ORDER — SPIRONOLACTONE 25 MG PO TABS
25.0000 mg | ORAL_TABLET | Freq: Every day | ORAL | Status: DC
  Administered 2017-02-16 – 2017-02-18 (×3): 25 mg via ORAL
  Filled 2017-02-16 (×3): qty 1

## 2017-02-16 MED ORDER — ATORVASTATIN CALCIUM 20 MG PO TABS
20.0000 mg | ORAL_TABLET | Freq: Every day | ORAL | Status: DC
  Administered 2017-02-16 – 2017-02-17 (×2): 20 mg via ORAL
  Filled 2017-02-16 (×2): qty 1

## 2017-02-16 MED ORDER — IPRATROPIUM-ALBUTEROL 0.5-2.5 (3) MG/3ML IN SOLN
3.0000 mL | Freq: Two times a day (BID) | RESPIRATORY_TRACT | Status: DC
  Administered 2017-02-16 – 2017-02-17 (×4): 3 mL via RESPIRATORY_TRACT
  Filled 2017-02-16 (×5): qty 3

## 2017-02-16 MED ORDER — METOPROLOL SUCCINATE ER 50 MG PO TB24
50.0000 mg | ORAL_TABLET | Freq: Every day | ORAL | Status: DC
  Administered 2017-02-16 – 2017-02-18 (×3): 50 mg via ORAL
  Filled 2017-02-16 (×3): qty 1

## 2017-02-16 MED ORDER — SODIUM CHLORIDE 0.9 % IV SOLN
1.0000 g | INTRAVENOUS | Status: DC
  Administered 2017-02-16 – 2017-02-17 (×2): 1 g via INTRAVENOUS
  Filled 2017-02-16 (×3): qty 10

## 2017-02-16 MED ORDER — PANTOPRAZOLE SODIUM 40 MG PO TBEC
40.0000 mg | DELAYED_RELEASE_TABLET | Freq: Every day | ORAL | Status: DC
  Administered 2017-02-16 – 2017-02-18 (×3): 40 mg via ORAL
  Filled 2017-02-16 (×3): qty 1

## 2017-02-16 MED ORDER — TECHNETIUM TC 99M DIETHYLENETRIAME-PENTAACETIC ACID
30.3000 | Freq: Once | INTRAVENOUS | Status: AC | PRN
  Administered 2017-02-16: 30.3 via RESPIRATORY_TRACT

## 2017-02-16 MED ORDER — ONDANSETRON HCL 4 MG/2ML IJ SOLN: 4.0000 mg | Freq: Four times a day (QID) | INTRAMUSCULAR | Status: DC | PRN

## 2017-02-16 MED ORDER — SENNOSIDES-DOCUSATE SODIUM 8.6-50 MG PO TABS: 1.0000 | ORAL_TABLET | Freq: Every evening | ORAL | Status: DC | PRN

## 2017-02-16 MED ORDER — GUAIFENESIN ER 600 MG PO TB12
600.0000 mg | ORAL_TABLET | Freq: Two times a day (BID) | ORAL | Status: DC
  Administered 2017-02-16 – 2017-02-18 (×5): 600 mg via ORAL
  Filled 2017-02-16 (×5): qty 1

## 2017-02-16 MED ORDER — TECHNETIUM TO 99M ALBUMIN AGGREGATED
4.2300 | Freq: Once | INTRAVENOUS | Status: AC | PRN
  Administered 2017-02-16: 4.23 via INTRAVENOUS

## 2017-02-16 MED ORDER — LORATADINE 10 MG PO TABS
10.0000 mg | ORAL_TABLET | Freq: Every day | ORAL | Status: DC
  Administered 2017-02-16 – 2017-02-17 (×2): 10 mg via ORAL
  Filled 2017-02-16 (×3): qty 1

## 2017-02-16 MED ORDER — HYDROCODONE-ACETAMINOPHEN 5-325 MG PO TABS: 1.0000 | ORAL_TABLET | ORAL | Status: DC | PRN

## 2017-02-16 MED ORDER — BISACODYL 5 MG PO TBEC: 5.0000 mg | DELAYED_RELEASE_TABLET | Freq: Every day | ORAL | Status: DC | PRN

## 2017-02-16 MED ORDER — ACETAMINOPHEN 325 MG PO TABS: 650.0000 mg | ORAL_TABLET | Freq: Four times a day (QID) | ORAL | Status: DC | PRN

## 2017-02-16 MED ORDER — ADULT MULTIVITAMIN W/MINERALS CH
1.0000 | ORAL_TABLET | Freq: Every day | ORAL | Status: DC
  Administered 2017-02-16 – 2017-02-18 (×3): 1 via ORAL
  Filled 2017-02-16 (×3): qty 1

## 2017-02-16 MED ORDER — INSULIN DETEMIR 100 UNIT/ML ~~LOC~~ SOLN
8.0000 [IU] | Freq: Two times a day (BID) | SUBCUTANEOUS | Status: DC
  Administered 2017-02-16 – 2017-02-18 (×6): 8 [IU] via SUBCUTANEOUS
  Filled 2017-02-16 (×6): qty 0.08

## 2017-02-16 MED ORDER — SODIUM CHLORIDE 0.9 % IV SOLN: 250.0000 mL | INTRAVENOUS | Status: DC | PRN

## 2017-02-16 MED ORDER — AZITHROMYCIN 500 MG IV SOLR
500.0000 mg | INTRAVENOUS | Status: DC
  Administered 2017-02-16 – 2017-02-17 (×2): 500 mg via INTRAVENOUS
  Filled 2017-02-16 (×3): qty 500

## 2017-02-16 MED ORDER — OMEGA-3-ACID ETHYL ESTERS 1 G PO CAPS
1.0000 g | ORAL_CAPSULE | Freq: Two times a day (BID) | ORAL | Status: DC
  Administered 2017-02-16 – 2017-02-18 (×5): 1 g via ORAL
  Filled 2017-02-16 (×5): qty 1

## 2017-02-16 NOTE — H&P (Signed)
History and Physical    Larry Mitchell DXA:[id number] DOB: [birth year] DOA: 02/15/2017  PCP: Myrla Halsted, MD   Patient coming from: Home, by way of [facility]   Chief Complaint: SOB, hypoxia, fleeting chest pains   HPI: Larry Mitchell is a 72 y.o. male with medical history significant for insulin-dependent diabetes mellitus, hypertension, coronary artery disease, chronic diastolic CHF, chronic kidney disease stage III, and COPD with chronic hypoxic respiratory failure, now presenting to the emergency department for evaluation of shortness of breath, cough, occasional chest pain, and hypoxia at home.  Patient reports that his chronic dyspnea has been worse over the past 2 weeks, but particularly over the past 2-3 days.  Over the past couple days, he has had worsening cough, shortness of breath, and has experienced occasional fleeting chest discomfort localized to the left lower chest with no alleviating or exacerbating factors identified.  He denies abdominal pain, headaches, vomiting, diarrhea, sore throat, or rhinorrhea.  Reports that his O2 saturation was 77% on his usual 2 L/min of supplemental oxygen today, prompting him to seek evaluation in the ED.  [facility] ED Course: Upon arrival to the ED, patient is found to be afebrile, saturating mid 90s on 4 L/min of supplemental oxygen, and with vitals otherwise normal.  EKG features a sinus rhythm with RBBB, LAFB, and marked ST-T abnormality in the lateral leads, with T wave changes more pronounced than prior.  Chest x-ray is notable for emphysematous changes and left basilar airspace disease concerning for pneumonia.  Chemistry panel reveals a BUN of 57 and creatinine of 2.07, consistent with his apparent baseline.  CBC is notable for leukocytosis to 12,300 and a microcytic anemia with hemoglobin of 8.2 and MCV of 106.6.  D-dimer is elevated to 0.87, pro NT BNP elevated to 6911, and troponin is elevated to 0.11.  Patient was given a dose of  Lovenox 1 mg/kg, DuoNeb, Rocephin, and azithromycin in the ED.  Cardiology was consulted by the ED physician, agreed to see the patient in consultation, recommended medical admission.  Patient remains hemodynamically stable, admitted to the telemetry unit for ongoing evaluation and management of community-acquired pneumonia with acute on chronic hypoxic respiratory failure and recent fleeting chest pains with elevated troponin and elevated d-dimer.  Review of Systems:  All other systems reviewed and apart from HPI, are negative.  Past Medical History:  Diagnosis Date  . Carotid artery disease ([place])   . Chronic edema   . CKD (chronic kidney disease) stage 3, GFR 30-59 ml/min (HCC)   . Essential hypertension   . History of anemia   . Hyperkalemia    a. requiring d/c of ACEI and spironolactone in 2018.  [name] IDDM (insulin dependent diabetes mellitus) ([place])   . LBBB (left bundle branch block)   . PAT (paroxysmal atrial tachycardia) (HCC)    a. 15 beat run seen on tele 07/2016.  . Stroke [facility])    a. occult stroke seen on imaging at [facility] in 2018.    Past Surgical History:  Procedure Laterality Date  . BACK SURGERY    . NECK SURGERY       reports that he quit smoking about 18 years ago. His smoking use included cigarettes. He started smoking about 57 years ago. He has a 57.00 pack-year smoking history. he has never used smokeless tobacco. He reports that he drinks about 0.6 oz of alcohol per week. He reports that he does not use drugs.  No Known Allergies  Family  History  Problem Relation Age of Onset  . Heart failure Mother        Age of onset - 41     Prior to Admission medications   Medication Sig Start Date End Date Taking? Authorizing Provider  albuterol (PROVENTIL HFA;VENTOLIN HFA) 108 (90 Base) MCG/ACT inhaler Inhale 1 puff into the lungs every 6 (six) hours as needed for wheezing or shortness of breath.    [provider]  aspirin 81 MG tablet Take 1 tablet  (81 mg total) by mouth daily. 07/09/16   Dunn, Nedra Hai, PA-C  atorvastatin (LIPITOR) 20 MG tablet Take 20 mg by mouth daily.    [provider]  insulin NPH-regular Human (NOVOLIN 70/30) (70-30) 100 UNIT/ML injection Inject 15 Units into the skin 2 (two) times daily with a meal. Patient taking differently: Inject 20 Units into the skin 2 (two) times daily with a meal.  07/09/16   Dunn, Dayna N, PA-C  loratadine (CLARITIN) 10 MG tablet Take 10 mg by mouth daily.    [provider]  metoprolol succinate (TOPROL-XL) 50 MG 24 hr tablet Take 50 mg by mouth daily. Take with or immediately following a meal.    [provider]  Multiple Vitamin (MULTIVITAMIN) tablet Take 1 tablet by mouth daily.    [provider]  Multiple Vitamins-Minerals (PRESERVISION AREDS PO) Take 1 capsule by mouth 2 (two) times daily.    [provider]  Omega-3 Fatty Acids (FISH OIL) 1000 MG CPDR Take 1,000 mg by mouth 2 (two) times daily.    [provider]  omeprazole (PRILOSEC) 20 MG capsule Take 20 mg by mouth daily.    [provider]  spironolactone (ALDACTONE) 25 MG tablet Take 25 mg by mouth daily.    [provider]  tiotropium (SPIRIVA) 18 MCG inhalation capsule Place 18 mcg into inhaler and inhale daily.    [provider]    Physical Exam: Vitals:   02/15/17 2252  BP: (!) 156/46  Pulse: 71  Resp: 19  Temp: 98.4 F (36.9 C)  TempSrc: Oral  SpO2: 93%  Weight: 65.7 kg (144 lb 14.4 oz)  Height: 5\' 7"  (1.702 m)      Constitutional: NAD, calm  Eyes: PERTLA, lids and conjunctivae normal ENMT: Mucous membranes are moist. Posterior pharynx clear of any exudate or lesions.   Neck: normal, supple, no masses, no thyromegaly Respiratory: Coarse rhonchi at bases. Occasional expiratory wheeze. Normal respiratory effort. No accessory muscle use.  Cardiovascular: S1 & S2 heard, regular rate and rhythm. 2+ pretibial edema bilaterally. No  significant JVD. Abdomen: No distension, no tenderness, no masses palpated. Bowel sounds normal.  Musculoskeletal: no clubbing / cyanosis. No joint deformity upper and lower extremities.    Skin: no significant rashes, lesions, ulcers. Warm, dry, well-perfused. Neurologic: CN 2-12 grossly intact. Sensation intact. Strength 5/5 in all 4 limbs.  Psychiatric: Alert and oriented x 3. Calm, cooperative.     Labs on Admission: I have personally reviewed following labs and imaging studies  CBC: Recent Labs  Lab 02/16/17 0011  WBC 10.9*  NEUTROABS 9.1*  HGB 7.7*  HCT 22.7*  MCV 104.6*  PLT 616   Basic Metabolic Panel: No results for input(s): NA, K, CL, CO2, GLUCOSE, BUN, CREATININE, CALCIUM, MG, PHOS in the last 168 hours. GFR: CrCl cannot be calculated (Patient's most recent lab result is older than the maximum 21 days allowed.). Liver Function Tests: No results for input(s): AST, ALT, ALKPHOS, BILITOT, PROT, ALBUMIN  in the last 168 hours. No results for input(s): LIPASE, AMYLASE in the last 168 hours. No results for input(s): AMMONIA in the last 168 hours. Coagulation Profile: No results for input(s): INR, PROTIME in the last 168 hours. Cardiac Enzymes: No results for input(s): CKTOTAL, CKMB, CKMBINDEX, TROPONINI in the last 168 hours. BNP (last 3 results) No results for input(s): PROBNP in the last 8760 hours. HbA1C: No results for input(s): HGBA1C in the last 72 hours. CBG: Recent Labs  Lab 02/15/17 2300  GLUCAP 120*   Lipid Profile: No results for input(s): CHOL, HDL, LDLCALC, TRIG, CHOLHDL, LDLDIRECT in the last 72 hours. Thyroid Function Tests: No results for input(s): TSH, T4TOTAL, FREET4, T3FREE, THYROIDAB in the last 72 hours. Anemia Panel: No results for input(s): VITAMINB12, FOLATE, FERRITIN, TIBC, IRON, RETICCTPCT in the last 72 hours. Urine analysis: No results found for: COLORURINE, APPEARANCEUR, LABSPEC, PHURINE, GLUCOSEU, HGBUR, BILIRUBINUR, KETONESUR,  PROTEINUR, UROBILINOGEN, NITRITE, LEUKOCYTESUR Sepsis Labs: @LABRCNTIP (procalcitonin:4,lacticidven:4) )No results found for this or any previous visit (from the past 240 hour(s)).   Radiological Exams on Admission: No results found.  EKG: Independently reviewed. Sinus rhythm, RBBB, LAFB, ST-T abnormality in lateral leads. Lateral T-wave inversions new since July '18.  Assessment/Plan  1. CAP; acute on chronic hypoxic respiratory failure  - Presents with cough, SOB, and hypoxia  - Found to have leukocytosis and LLL infiltrate on CXR concerning for pneumonia  - Treated in ED with Rocephin and azithromycin  - Obtain cultures, check strep pneumo and legionella antigens, trend procalcitonin, continue current abx, supportive care with prn nebs and supplemental O2    2. Chest pain; CAD; elevated troponin; elevated d-dimer  - Hx of CAD; reports occasional pain in left chest over past few days  - EKG with marked ST-T abnormality with T-wave inversions laterally that appear new  - Troponin is elevated to 0.11 in ED; d-dimer elevated to 0.87   - No chest pain since arrival in ED   - Treated with treatment-dose Lovenox x1 in ED  - Cardiology consulted by EDP, will follow-up recs  - Continue cardiac monitoring, trend troponin, continue anticoagulation pending V/Q scan, continue ASA, Lipitor, Toprol   3. Chronic diastolic CHF  - Appears mildly hypervolemic  - SLIV, fluid-restrict, continue home-dose diuretic for now, follow daily wt and I/O's, continue beta-blocker   4. Hypertension  - BP at goal  - Continue beta-blocker    5. Insulin-dependent DM   - No A1c on file  - Managed at home with Novolin 70/30 20 units BID  - Serum glucose only 72 in ED  - Follow CBG's, treat with Levemir 8 units BID and SSI with Novolog    6. CKD stage III  - SCr is 2.07, consistent with his apparent baseline  - Renally-dose medications, avoid nephrotoxins, repeat chem panel in am    7. Macrocytic anemia  -  Hgb is 8.2 with MCV 106.6 at outside hospital  - Denies bleeding; H&H similar to prior  - Check anemia panel, type and screen, follow closely while on anticoagulation    8. COPD  - Chronic 2 Lpm supplemental O2 requirement  - Hold Spiriva and schedule DuoNeb, start prn albuterol in setting of CAP; abx as above     DVT prophylaxis: IV heparin infusion  Code Status: Full  Family Communication: Discussed with patient Disposition Plan: Admit to telemetry Consults called: Cardiology Admission status: Inpatient    Vianne Bulls, MD [facility] Pager [phone]  If 7PM-7AM, please contact night-coverage  [email or web address] Password [email or web address]  02/16/2017, 1:37 AM

## 2017-02-16 NOTE — Progress Notes (Signed)
*  PRELIMINARY RESULTS* Echocardiogram 2D Echocardiogram has been performed.  Larry Mitchell 02/16/2017, 4:48 PM

## 2017-02-16 NOTE — Consult Note (Signed)
Cardiology Consultation:   Patient ID: KARSEN NAKANISHI; [id number]; 12-15-1945   Admit date: 02/15/2017 Date of Consult: 02/16/2017  Primary Care Provider: Myrla Halsted, MD Primary Cardiologist: Dr. Oval Linsey  Patient Profile:   Larry Mitchell is a 72 y.o. male with a hx of hypertension, chronic kidney disease stage III, COPD with chronic respiratory failure on oxygen at night, paroxysmal atrial tachycardia, left bundle branch block, diabetes, chronic lower extremity edema, and stroke who is being seen today for the evaluation of shortness of breath at the request of Dr. Aggie Moats.  Admitted 07/2016 for weakness, unresponsiveness/groaning/drooling, felt due to hypoglycemia in the setting of taking insulin without eating. EKG shows NSR, HR 70, with LBBB (unknown old vs. New). Nuc showed prior myocardial infarction with a mild degree of peri-infarct ischemia, EF 48% - echo this admission showed EF 55-60% without WMA, mildly dilated LA, mild TR. No cath done due to lack of anginal symptoms and renal insufficiency.   Last seen by Dr. Domenic Polite September 2018.  No cardiac complaints.  Continued medical therapy for abnormal echocardiogram recommended.  History of Present Illness:   Larry Mitchell presented for worsening shortness of breath for the past 2 weeks.  Patient states test he was seen by pulmonary at [facility] 3 weeks ago and started on oxygen at night due to hypoxia.  Since then he has progressive worsening of shortness of breath.  Afterwards patient had cold symptoms.  He has chronic lower extremity edema recently worsened.  He has intermittent orthopnea without PND.  Denies any chest pain, palpitation, PND or syncope.  No melena.  In ER patient found to have hypoxia with possible community-acquired pneumonia in setting of acute on chronic hypoxic respiratory failure.  Elevated d-dimer.  VQ scan showed low probability for pulmonary embolism.  Troponin I of 0.04-->0.03.  Serum creatinine 2.24.  Potassium  3.4.  Hemoglobin 7.7. Given 500cc bolus NS.   Past Medical History:  Diagnosis Date  . Carotid artery disease ([place])   . Chronic edema   . CKD (chronic kidney disease) stage 3, GFR 30-59 ml/min (HCC)   . Essential hypertension   . History of anemia   . Hyperkalemia    a. requiring d/c of ACEI and spironolactone in 2018.  [name] IDDM (insulin dependent diabetes mellitus) ([place])   . LBBB (left bundle branch block)   . PAT (paroxysmal atrial tachycardia) (HCC)    a. 15 beat run seen on tele 07/2016.  . Stroke [facility])    a. occult stroke seen on imaging at [facility] in 2018.    Past Surgical History:  Procedure Laterality Date  . BACK SURGERY    . NECK SURGERY         Inpatient Medications: Scheduled Meds: . aspirin EC  81 mg Oral Daily  . atorvastatin  20 mg Oral q1800  . guaiFENesin  600 mg Oral BID  . insulin aspart  0-5 Units Subcutaneous QHS  . insulin aspart  0-9 Units Subcutaneous TID WC  . insulin detemir  8 Units Subcutaneous BID  . ipratropium-albuterol  3 mL Nebulization BID  . loratadine  10 mg Oral Daily  . metoprolol succinate  50 mg Oral Daily  . multivitamin with minerals  1 tablet Oral Daily  . omega-3 acid ethyl esters  1 g Oral BID  . pantoprazole  40 mg Oral Daily  . sodium chloride flush  3 mL Intravenous Q12H  . sodium chloride flush  3 mL Intravenous Q12H  . spironolactone  25 mg Oral Daily   Continuous Infusions: . sodium chloride    . azithromycin    . cefTRIAXone (ROCEPHIN)  IV    . sodium chloride     PRN Meds: sodium chloride, acetaminophen, albuterol, bisacodyl, HYDROcodone-acetaminophen, ondansetron (ZOFRAN) IV, senna-docusate, sodium chloride flush  Allergies:   No Known Allergies  Social History:   Social History   Socioeconomic History  . Marital status: Married    Spouse name: Not on file  . Number of children: Not on file  . Years of education: Not on file  . Highest education level: Not on file  Social Needs  . Financial  resource strain: Not on file  . Food insecurity - worry: Not on file  . Food insecurity - inability: Not on file  . Transportation needs - medical: Not on file  . Transportation needs - non-medical: Not on file  Occupational History  . Not on file  Tobacco Use  . Smoking status: Former Smoker    Packs/day: 1.00    Years: 57.00    Pack years: 57.00    Types: Cigarettes    Start date: 09/19/1959    Last attempt to quit: 09/19/1998    Years since quitting: 18.4  . Smokeless tobacco: Never Used  Substance and Sexual Activity  . Alcohol use: Yes    Alcohol/week: 0.6 oz    Types: 1 Cans of beer per week  . Drug use: No  . Sexual activity: Not on file  Other Topics Concern  . Not on file  Social History Narrative  . Not on file    Family History:   Family History  Problem Relation Age of Onset  . Heart failure Mother        Age of onset - 1     ROS:  Please see the history of present illness.  All other ROS reviewed and negative.     Physical Exam/Data:   Vitals:   02/15/17 2252 02/16/17 0421 02/16/17 0913  BP: (!) 156/46 (!) 145/79   Pulse: 71 75   Resp: 19    Temp: 98.4 F (36.9 C) 98 F (36.7 C)   TempSrc: Oral Oral   SpO2: 93% 94% 96%  Weight: 144 lb 14.4 oz (65.7 kg) 144 lb 12.8 oz (65.7 kg)   Height: 5\' 7"  (1.702 m)     No intake or output data in the 24 hours ending 02/16/17 1026 Filed Weights   02/15/17 2252 02/16/17 0421  Weight: 144 lb 14.4 oz (65.7 kg) 144 lb 12.8 oz (65.7 kg)   Body mass index is 22.68 kg/m.  General:  Well nourished, well developed, in no acute distress HEENT: normal Lymph: no adenopathy Neck: no JVD Endocrine:  No thryomegaly Vascular: No carotid bruits; FA pulses 2+ bilaterally without bruits  Cardiac:  normal S1, S2; RRR; no murmur  Lungs: Lungs are relatively clear with coarse breath sounds  abd: soft, nontender, no hepatomegaly  Ext: 2+ bilateral lower extremity edema L > R Musculoskeletal:  No deformities, BUE and BLE  strength normal and equal Skin: warm and dry  Neuro:  CNs 2-12 intact, no focal abnormalities noted Psych:  Normal affect   EKG:  The EKG was personally reviewed and demonstrates: EKG shows sinus rhythm with left bundle branch block and T wave inversion in lateral lead (look in chart)-seems somewhat more pronounced compared to prior EKG of 07/2016. Telemetry:  Telemetry was personally reviewed and demonstrates: Sinus rhythm with PAC  Relevant CV Studies:  As summarized above  Laboratory Data:  Chemistry Recent Labs  Lab 02/16/17 0011  NA 140  K 3.4*  CL 105  CO2 23  GLUCOSE 142*  BUN 56*  CREATININE 2.24*  CALCIUM 8.3*  GFRNONAA 27*  GFRAA 32*  ANIONGAP 12    Recent Labs  Lab 02/16/17 0011  PROT 6.0*  ALBUMIN 2.6*  AST 25  ALT 19  ALKPHOS 64  BILITOT 0.8   Hematology Recent Labs  Lab 02/16/17 0011 02/16/17 0230  WBC 10.9*  --   RBC 2.17* 2.21*  HGB 7.7*  --   HCT 22.7*  --   MCV 104.6*  --   MCH 35.5*  --   MCHC 33.9  --   RDW 14.3  --   PLT 217  --    Cardiac Enzymes Recent Labs  Lab 02/16/17 0011 02/16/17 0230  TROPONINI 0.04* 0.03*   No results for input(s): TROPIPOC in the last 168 hours.  BNPNo results for input(s): BNP, PROBNP in the last 168 hours.  DDimer No results for input(s): DDIMER in the last 168 hours.  Radiology/Studies:  Dg Chest 2 View  Result Date: 02/16/2017 CLINICAL DATA:  Shortness of breath. EXAM: CHEST  2 VIEW COMPARISON:  Chest x-ray from yesterday. FINDINGS: The heart size and mediastinal contours are within normal limits. Normal pulmonary vascularity. The lungs remain hyperinflated. Unchanged patchy opacities in the left lower lobe. Trace bilateral pleural effusions. No pneumothorax. No acute osseous abnormality. IMPRESSION: 1. Unchanged left lower lobe airspace disease, concerning for pneumonia. 2. Trace bilateral pleural effusions. 3. COPD. Electronically Signed   By: Titus Dubin M.D.   On: 02/16/2017 09:09   Nm  Pulmonary Perf And Vent  Result Date: 02/16/2017 CLINICAL DATA:  Evaluate for suspected pulmonary embolism. Positive D-dimer. Shortness of breath and cough with occasional chest pain and hypoxia. Chronic dyspnea worse over the past 2 weeks. EXAM: NUCLEAR MEDICINE VENTILATION - PERFUSION LUNG SCAN TECHNIQUE: Ventilation images were obtained in multiple projections using inhaled aerosol Tc-48m DTPA. Perfusion images were obtained in multiple projections after intravenous injection of Tc-45m-MAA. RADIOPHARMACEUTICALS:  30.3 mCi of Tc-31m DTPA aerosol inhalation and 4.23 mCi Tc70m-MAA IV COMPARISON:  Chest x-ray 02/16/2017 and 02/15/2017 FINDINGS: Recent chest x-ray demonstrates small bilateral pleural effusions and possible lingular airspace process as well as possible emphysematous disease. Ventilation: Central airway clumping of the radiotracer. Mild bilateral heterogeneous radiotracer uptake worse than the perfusion images suggesting emphysematous/small airways disease. Perfusion: No wedge shaped peripheral perfusion defects to suggest acute pulmonary embolism. Mild bilateral nonsegmental defects improved compared to the ventilation images. IMPRESSION: Low probability for pulmonary embolism. Electronically Signed   By: Marin Olp M.D.   On: 02/16/2017 09:31    Assessment and Plan:   1. Elevated troponin -The patient denies any chest pain.  Troponin minimally elevated with flat trend.  Not in ACS pattern.  EKG this admission showed slight change in lateral lead.  MD to review further. -Likely demand ischemia in setting of anemia, renal insufficiency and pulmonary issue. -He did had abnormal Myoview before which managed medically due to lack of anginal symptoms and renal insufficiency. -Pending echocardiogram this admission.  2.  Chronic bilateral lower extremity edema -Questionable recently worsened.  Prior workup negative.  Pending echocardiogram this admission to reassess LVEF. BNP is pending.    3. Acute on chronic respiratory hypoxic failure with community-acquired pneumonia -Primary team  4.  Anemia -per primary team   5. Acute on CKD stage III - Scr of  2.24. Seems baseline around 1.7 in 07/2016. Given 500CC of NS by attending team.    For questions or updates, please contact [place] Please consult [email or web address] for contact info under Cardiology/STEMI.   Jarrett Soho, [place]  02/16/2017 10:26 AM

## 2017-02-16 NOTE — Progress Notes (Signed)
[facility] update note  Reviewed the H&P written by Dr. Myna Hidalgo , agree with current plan of care.  Appear dehydrated on exam. NS 500cc bolus. Also ordered IS, flutter valve and mucinex bid.  VQ scan neg.  Elwin Mocha MD

## 2017-02-17 DIAGNOSIS — D631 Anemia in chronic kidney disease: Secondary | ICD-10-CM

## 2017-02-17 LAB — GLUCOSE, CAPILLARY
GLUCOSE-CAPILLARY: 207 mg/dL — AB (ref 65–99)
GLUCOSE-CAPILLARY: 260 mg/dL — AB (ref 65–99)
Glucose-Capillary: 77 mg/dL (ref 65–99)
Glucose-Capillary: 223 mg/dL — ABNORMAL HIGH (ref 65–99)

## 2017-02-17 LAB — URINALYSIS, ROUTINE W REFLEX MICROSCOPIC
BILIRUBIN URINE: NEGATIVE
Glucose, UA: 150 mg/dL — AB
Hgb urine dipstick: NEGATIVE
Ketones, ur: NEGATIVE mg/dL
Leukocytes, UA: NEGATIVE
NITRITE: NEGATIVE
PH: 5 (ref 5.0–8.0)
Protein, ur: 100 mg/dL — AB
SPECIFIC GRAVITY, URINE: 1.013 (ref 1.005–1.030)

## 2017-02-17 LAB — BASIC METABOLIC PANEL
ANION GAP: 9 (ref 5–15)
Anion gap: 10 (ref 5–15)
BUN: 63 mg/dL — ABNORMAL HIGH (ref 6–20)
BUN: 60 mg/dL — ABNORMAL HIGH (ref 6–20)
CHLORIDE: 108 mmol/L (ref 101–111)
CO2: 25 mmol/L (ref 22–32)
CO2: 23 mmol/L (ref 22–32)
Calcium: 7.8 mg/dL — ABNORMAL LOW (ref 8.9–10.3)
Calcium: 7.6 mg/dL — ABNORMAL LOW (ref 8.9–10.3)
Chloride: 104 mmol/L (ref 101–111)
Creatinine, Ser: 2.57 mg/dL — ABNORMAL HIGH (ref 0.61–1.24)
Creatinine, Ser: 2.46 mg/dL — ABNORMAL HIGH (ref 0.61–1.24)
GFR calc Af Amer: 28 mL/min — ABNORMAL LOW (ref >60)
GFR calc non Af Amer: 23 mL/min — ABNORMAL LOW (ref >60)
GFR calc non Af Amer: 24 mL/min — ABNORMAL LOW (ref >60)
GFR, EST AFRICAN AMERICAN: 27 mL/min — AB (ref >60)
Glucose, Bld: 169 mg/dL — ABNORMAL HIGH (ref 65–99)
Glucose, Bld: 285 mg/dL — ABNORMAL HIGH (ref 65–99)
POTASSIUM: 3.8 mmol/L (ref 3.5–5.1)
Potassium: 3.9 mmol/L (ref 3.5–5.1)
Sodium: 142 mmol/L (ref 135–145)
Sodium: 137 mmol/L (ref 135–145)

## 2017-02-17 LAB — CBC WITH DIFFERENTIAL/PLATELET
BASOS PCT: 0 %
Basophils Absolute: 0 10*3/uL (ref 0.0–0.1)
Eosinophils Absolute: 0.2 10*3/uL (ref 0.0–0.7)
Eosinophils Relative: 3 %
HEMATOCRIT: 20.5 % — AB (ref 39.0–52.0)
HEMOGLOBIN: 6.9 g/dL — AB (ref 13.0–17.0)
LYMPHS PCT: 15 %
Lymphs Abs: 1 10*3/uL (ref 0.7–4.0)
MCH: 35.8 pg — AB (ref 26.0–34.0)
MCHC: 33.7 g/dL (ref 30.0–36.0)
MCV: 106.2 fL — ABNORMAL HIGH (ref 78.0–100.0)
Monocytes Absolute: 0.5 10*3/uL (ref 0.1–1.0)
Monocytes Relative: 7 %
NEUTROS PCT: 75 %
Neutro Abs: 5.1 10*3/uL (ref 1.7–7.7)
Platelets: 226 10*3/uL (ref 150–400)
RBC: 1.93 MIL/uL — ABNORMAL LOW (ref 4.22–5.81)
RDW: 14.2 % (ref 11.5–15.5)
WBC: 6.8 10*3/uL (ref 4.0–10.5)

## 2017-02-17 LAB — HEMOGLOBIN AND HEMATOCRIT, BLOOD
HCT: 26.8 % — ABNORMAL LOW (ref 39.0–52.0)
HEMOGLOBIN: 8.9 g/dL — AB (ref 13.0–17.0)

## 2017-02-17 LAB — PREPARE RBC (CROSSMATCH)

## 2017-02-17 LAB — PROCALCITONIN: Procalcitonin: 0.14 ng/mL

## 2017-02-17 LAB — MAGNESIUM: Magnesium: 2 mg/dL (ref 1.7–2.4)

## 2017-02-17 LAB — STREP PNEUMONIAE URINARY ANTIGEN: Strep Pneumo Urinary Antigen: NEGATIVE

## 2017-02-17 MED ORDER — SODIUM CHLORIDE 0.9 % IV SOLN: Freq: Once | INTRAVENOUS | Status: DC

## 2017-02-17 MED ORDER — METOPROLOL TARTRATE 5 MG/5ML IV SOLN
INTRAVENOUS | Status: AC
  Administered 2017-02-17: 2.5 mg
  Filled 2017-02-17: qty 5

## 2017-02-17 MED ORDER — METOPROLOL TARTRATE 5 MG/5ML IV SOLN
2.5000 mg | Freq: Once | INTRAVENOUS | Status: AC
Start: 2017-02-17 — End: 2017-02-17
  Administered 2017-02-17: 2.5 mg via INTRAVENOUS

## 2017-02-17 NOTE — Progress Notes (Signed)
Pt had a 12/06/18 beat run of V-Tachy and upon assessment was found to have an elevated SBP in the 170's and was SOB in bed, lungs sound relatively clear with sats upper 90's on 2l O2, will text page MD and continue to monitor.

## 2017-02-17 NOTE — Progress Notes (Signed)
Pt received 2.5 mg  Lopressor slow IVP, will continue to monitor HR and B/P but pt is asymptomatic at this time, will continue to monitor. Pt has some labs ordered and I explained the POC to him, awaiting further information and orders as per MD.

## 2017-02-17 NOTE — Progress Notes (Signed)
Orders were placed to transfuse 1 unit of PRBC's at this time but patient doesn't have a consent. Blood bank called and said the blood was ready. I tried to explain again and read the blood consent to him with the risks and benefits and that the risks were very low but he still was apprehensive. I called the MD back and he spoke with the patient who is still refusing to take blood at this time. The patient would like to speak with the MD when his wife comes in this morning and then make his decision. Pt remains asymptomatic at this time, will continue to monitor.

## 2017-02-17 NOTE — Progress Notes (Signed)
[facility] PROGRESS NOTE  Larry Mitchell NLG:[id number] DOB: Aug 03, 1945 DOA: 02/15/2017 PCP: Myrla Halsted, MD  Assessment/Plan:  CAP; acute on chronic hypoxic respiratory failure  - Presented with cough, SOB, and hypoxia  - Found to have leukocytosis and LLL infiltrate on CXR concerning for pneumonia  - Treated in ED with Rocephin and azithromycin, this was cont - Blood cult x 2-->P  Anemia - checking UA and FOBT - anemia panel shows iron def - pt agreeable to transfusion  Chest pain; CAD; elevated troponin; elevated d-dimer  - Hx of CAD; reports occasional pain in left chest over past few days  - EKG with marked ST-T abnormality with T-wave inversions laterally that appear new  - Troponin is elevated to 0.11 in ED; d-dimer elevated to 0.87   - No chest pain since arrival in ED   - Treated with treatment-dose Lovenox x1 in ED  - Cardiology consulted by EDP-->Called cardiology to reconsult and they advised that since pt was 24 hours out and doing well they had nothing to offer. Discussed labs and ECHO results. Likely due to CAP. - Continue ASA, Lipitor, Toprol  - VQ scan neg so heparin stopped  Chronic diastolic CHF  - SLIV, fluid-restrict, continue home-dose diuretic for now, follow daily wt and I/O's, continue beta-blocker   Hypertension  - BP at goal  - Continue beta-blocker     Insulin-dependent DM   - No A1c on file  - Managed at home with Novolin 70/30 20 units BID  - Serum glucose only 72 in ED  - Follow CBG's, treat with Levemir 8 units BID and SSI with Novolog    CKD stage III  -  consistent with his apparent baseline  - Renally-dose medications, avoid nephrotoxins, repeat chem panel in am    Macrocytic anemia  - Hgb is 8.2 with MCV 106.6 at outside hospital  - Denies bleeding; H&H similar to prior  - Check anemia panel, type and screen, follow closely while on anticoagulation    COPD  - Chronic 2 Lpm supplemental O2 requirement  - Hold  Spiriva and schedule DuoNeb, start prn albuterol in setting of CAP; abx as above     DVT prophylaxis: SCDs Code Status: Full  Family Communication: Discussed with patient Disposition Plan: Admit to telemetry Consults called: Cardiology Admission status: Inpatient    Antibiotics:  Rocephin and Azithro 2/16-->P (indicate start date, and stop date if known)  HPI/Subjective: Pt well. Wants to know when he can go home. No overnight events.  Objective: Vitals:   02/17/17 0816 02/17/17 1000  BP:  (!) 148/56  Pulse:  81  Resp:    Temp:    SpO2: 91%     Intake/Output Summary (Last 24 hours) at 02/17/2017 1016 Last data filed at 02/17/2017 0928 Gross per 24 hour  Intake 1450 ml  Output 150 ml  Net 1300 ml   Filed Weights   02/15/17 2252 02/16/17 0421 02/17/17 0500  Weight: 65.7 kg (144 lb 14.4 oz) 65.7 kg (144 lb 12.8 oz) 63.3 kg (139 lb 8.8 oz)    Exam:   General:  NAD, NCAT  Cardiovascular: RRR, no MRG  Respiratory: CTAB, nl wob  Abdomen: ND, BS+  Musculoskeletal: moving all extr   Data Reviewed: Basic Metabolic Panel: Recent Labs  Lab 02/16/17 0011 02/17/17 0335  NA 140 142  K 3.4* 3.8  CL 105 108  CO2 23 25  GLUCOSE 142* 169*  BUN 56* 63*  CREATININE 2.24* 2.57*  CALCIUM 8.3* 7.8*   Liver Function Tests: Recent Labs  Lab 02/16/17 0011  AST 25  ALT 19  ALKPHOS 64  BILITOT 0.8  PROT 6.0*  ALBUMIN 2.6*   No results for input(s): LIPASE, AMYLASE in the last 168 hours. No results for input(s): AMMONIA in the last 168 hours. CBC: Recent Labs  Lab 02/16/17 0011 02/17/17 0335  WBC 10.9* 6.8  NEUTROABS 9.1* 5.1  HGB 7.7* 6.9*  HCT 22.7* 20.5*  MCV 104.6* 106.2*  PLT 217 226   Cardiac Enzymes: Recent Labs  Lab 02/16/17 0011 02/16/17 0230 02/16/17 0940  TROPONINI 0.04* 0.03* 0.03*   BNP (last 3 results) Recent Labs    02/16/17 0011  BNP 870.9*    ProBNP (last 3 results) No results for input(s): PROBNP in the last 8760  hours.  CBG: Recent Labs  Lab 02/16/17 0732 02/16/17 1055 02/16/17 1613 02/16/17 2119 02/17/17 0743  GLUCAP 115* 111* 321* 71 77    No results found for this or any previous visit (from the past 240 hour(s)).   Studies: Dg Chest 2 View  Result Date: 02/16/2017 CLINICAL DATA:  Shortness of breath. EXAM: CHEST  2 VIEW COMPARISON:  Chest x-ray from yesterday. FINDINGS: The heart size and mediastinal contours are within normal limits. Normal pulmonary vascularity. The lungs remain hyperinflated. Unchanged patchy opacities in the left lower lobe. Trace bilateral pleural effusions. No pneumothorax. No acute osseous abnormality. IMPRESSION: 1. Unchanged left lower lobe airspace disease, concerning for pneumonia. 2. Trace bilateral pleural effusions. 3. COPD. Electronically Signed   By: Titus Dubin M.D.   On: 02/16/2017 09:09   Nm Pulmonary Perf And Vent  Result Date: 02/16/2017 CLINICAL DATA:  Evaluate for suspected pulmonary embolism. Positive D-dimer. Shortness of breath and cough with occasional chest pain and hypoxia. Chronic dyspnea worse over the past 2 weeks. EXAM: NUCLEAR MEDICINE VENTILATION - PERFUSION LUNG SCAN TECHNIQUE: Ventilation images were obtained in multiple projections using inhaled aerosol Tc-24m DTPA. Perfusion images were obtained in multiple projections after intravenous injection of Tc-2m-MAA. RADIOPHARMACEUTICALS:  30.3 mCi of Tc-33m DTPA aerosol inhalation and 4.23 mCi Tc49m-MAA IV COMPARISON:  Chest x-ray 02/16/2017 and 02/15/2017 FINDINGS: Recent chest x-ray demonstrates small bilateral pleural effusions and possible lingular airspace process as well as possible emphysematous disease. Ventilation: Central airway clumping of the radiotracer. Mild bilateral heterogeneous radiotracer uptake worse than the perfusion images suggesting emphysematous/small airways disease. Perfusion: No wedge shaped peripheral perfusion defects to suggest acute pulmonary embolism. Mild  bilateral nonsegmental defects improved compared to the ventilation images. IMPRESSION: Low probability for pulmonary embolism. Electronically Signed   By: Marin Olp M.D.   On: 02/16/2017 09:31    Scheduled Meds: . aspirin EC  81 mg Oral Daily  . atorvastatin  20 mg Oral q1800  . guaiFENesin  600 mg Oral BID  . insulin aspart  0-5 Units Subcutaneous QHS  . insulin aspart  0-9 Units Subcutaneous TID WC  . insulin detemir  8 Units Subcutaneous BID  . ipratropium-albuterol  3 mL Nebulization BID  . loratadine  10 mg Oral Daily  . metoprolol succinate  50 mg Oral Daily  . multivitamin with minerals  1 tablet Oral Daily  . omega-3 acid ethyl esters  1 g Oral BID  . pantoprazole  40 mg Oral Daily  . sodium chloride flush  3 mL Intravenous Q12H  . sodium chloride flush  3 mL Intravenous Q12H  . spironolactone  25 mg Oral Daily   Continuous Infusions: . sodium  chloride    . sodium chloride    . azithromycin Stopped (02/16/17 1849)  . cefTRIAXone (ROCEPHIN)  IV Stopped (02/16/17 1732)    Principal Problem:   CAP (community acquired pneumonia) Active Problems:   Elevated troponin   IDDM (insulin dependent diabetes mellitus) (HCC)   CKD (chronic kidney disease) stage 3, GFR 30-59 ml/min (HCC)   Macrocytic anemia   Essential hypertension   PAT (paroxysmal atrial tachycardia) (HCC)   Positive D dimer   Acute on chronic respiratory failure with hypoxia (HCC)   SOB (shortness of breath)    Time spent: 100min    Fuig. If 7PM-7AM, please contact night-coverage at [email or web address], password [facility] 02/17/2017, 10:16 AM  LOS: 2 days

## 2017-02-17 NOTE — Progress Notes (Signed)
Lab called with a Hemoglobin of 6.9 dropped from 7.7 yesterday. I paged [facility] after speaking with the patient to see if I could find out if he had noticed any blood in his stool, his urine is an amber color but doesn't look like blood and he has no bruising across his back. Pt's O2 sats are 96-98% on 2L O2 but he also has a history of COPD, awaiting a call back or orders placed into the computer. Pt is asymptomatic at this time. I spoke with the patient about his low blood count and the risks and benefits of receiving blood but he really doesn't want to receive someone else's blood at this time.

## 2017-02-18 DIAGNOSIS — I248 Other forms of acute ischemic heart disease: Secondary | ICD-10-CM

## 2017-02-18 DIAGNOSIS — I5032 Chronic diastolic (congestive) heart failure: Secondary | ICD-10-CM

## 2017-02-18 LAB — BASIC METABOLIC PANEL
Anion gap: 11 (ref 5–15)
BUN: 57 mg/dL — AB (ref 6–20)
CHLORIDE: 106 mmol/L (ref 101–111)
CO2: 24 mmol/L (ref 22–32)
Calcium: 8 mg/dL — ABNORMAL LOW (ref 8.9–10.3)
Creatinine, Ser: 2.38 mg/dL — ABNORMAL HIGH (ref 0.61–1.24)
GFR calc non Af Amer: 25 mL/min — ABNORMAL LOW (ref >60)
GFR, EST AFRICAN AMERICAN: 29 mL/min — AB (ref >60)
Glucose, Bld: 199 mg/dL — ABNORMAL HIGH (ref 65–99)
POTASSIUM: 3.9 mmol/L (ref 3.5–5.1)
SODIUM: 141 mmol/L (ref 135–145)

## 2017-02-18 LAB — CBC WITH DIFFERENTIAL/PLATELET
Basophils Absolute: 0 10*3/uL (ref 0.0–0.1)
Basophils Relative: 0 %
EOS ABS: 0.2 10*3/uL (ref 0.0–0.7)
Eosinophils Relative: 3 %
HCT: 27.3 % — ABNORMAL LOW (ref 39.0–52.0)
Hemoglobin: 9.1 g/dL — ABNORMAL LOW (ref 13.0–17.0)
LYMPHS ABS: 0.8 10*3/uL (ref 0.7–4.0)
Lymphocytes Relative: 12 %
MCH: 34.6 pg — AB (ref 26.0–34.0)
MCHC: 33.3 g/dL (ref 30.0–36.0)
MCV: 103.8 fL — ABNORMAL HIGH (ref 78.0–100.0)
Monocytes Absolute: 0.4 10*3/uL (ref 0.1–1.0)
Monocytes Relative: 6 %
NEUTROS PCT: 79 %
Neutro Abs: 5.3 10*3/uL (ref 1.7–7.7)
Platelets: 238 10*3/uL (ref 150–400)
RBC: 2.63 MIL/uL — AB (ref 4.22–5.81)
RDW: 16 % — ABNORMAL HIGH (ref 11.5–15.5)
WBC: 6.7 10*3/uL (ref 4.0–10.5)

## 2017-02-18 LAB — GLUCOSE, CAPILLARY: GLUCOSE-CAPILLARY: 150 mg/dL — AB (ref 65–99)

## 2017-02-18 LAB — BPAM RBC
BLOOD PRODUCT EXPIRATION DATE: 201903122359
ISSUE DATE / TIME: 201902171122
UNIT TYPE AND RH: 1700

## 2017-02-18 LAB — LEGIONELLA PNEUMOPHILA SEROGP 1 UR AG: L. PNEUMOPHILA SEROGP 1 UR AG: NEGATIVE

## 2017-02-18 LAB — TYPE AND SCREEN
ABO/RH(D): B NEG
Antibody Screen: NEGATIVE
UNIT DIVISION: 0

## 2017-02-18 LAB — OCCULT BLOOD X 1 CARD TO LAB, STOOL: Fecal Occult Bld: NEGATIVE

## 2017-02-18 MED ORDER — CEFPODOXIME PROXETIL 100 MG PO TABS: 100.0000 mg | ORAL_TABLET | Freq: Two times a day (BID) | ORAL | 0 refills | Status: AC

## 2017-02-18 MED ORDER — AZITHROMYCIN 500 MG PO TABS: 500.0000 mg | ORAL_TABLET | Freq: Every day | ORAL | 0 refills | Status: AC

## 2017-02-18 NOTE — Evaluation (Signed)
Physical Therapy Evaluation Patient Details Name: Larry Mitchell MRN: [id number] DOB: March 25, 1945 Today's Date: 02/18/2017   History of Present Illness  Larry Mitchell is a 72 y.o. male with medical history significant for insulin-dependent diabetes mellitus, hypertension, coronary artery disease, chronic diastolic CHF, chronic kidney disease stage III, and COPD with chronic hypoxic respiratory failure, now presenting to the emergency department for evaluation of shortness of breath, cough, occasional chest pain, and hypoxia at home.   Clinical Impression  Patient presents close to functional baseline, but with some weakness due to hospitalization and acute illness.  Currently S to mod I level.  Has home O2 and wife to assist.  No current follow up PT needs.  Did educate in energy conservation and fall prevention.  No further skilled PT needs.    Follow Up Recommendations No PT follow up    Equipment Recommendations  None recommended by PT    Recommendations for Other Services       Precautions / Restrictions Precautions Precautions: Fall      Mobility  Bed Mobility               General bed mobility comments: sitting EOB  Transfers Overall transfer level: Modified independent Equipment used: None                Ambulation/Gait Ambulation/Gait assistance: Supervision Ambulation Distance (Feet): 125 Feet Assistive device: None Gait Pattern/deviations: Step-through pattern;Drifts right/left;Decreased stride length     General Gait Details: slower pace, some episodes of veering from straight path, but no need for assist or cues.  Assist for O2 tank, SpO2 95% on 2L O2  Stairs            Wheelchair Mobility    Modified Rankin (Stroke Patients Only)       Balance Overall balance assessment: Needs assistance   Sitting balance-Leahy Scale: Good       Standing balance-Leahy Scale: Good                               Pertinent  Vitals/Pain Pain Assessment: No/denies pain    Home Living Family/patient expects to be discharged to:: Private residence Living Arrangements: Spouse/significant other Available Help at Discharge: Family;Available 24 hours/day Type of Home: House Home Access: Stairs to enter Entrance Stairs-Rails: Psychiatric nurse of Steps: 3 Home Layout: One level Home Equipment: Other (comment)(home oxygen)      Prior Function Level of Independence: Independent         Comments: drives and does some chores     Hand Dominance        Extremity/Trunk Assessment   Upper Extremity Assessment Upper Extremity Assessment: Overall WFL for tasks assessed    Lower Extremity Assessment Lower Extremity Assessment: Overall WFL for tasks assessed       Communication   Communication: No difficulties  Cognition Arousal/Alertness: Awake/alert Behavior During Therapy: WFL for tasks assessed/performed Overall Cognitive Status: Within Functional Limits for tasks assessed                                        General Comments General comments (skin integrity, edema, etc.): wife in room, educated pt on energy conservation tips for home as well as fall prevention    Exercises     Assessment/Plan    PT Assessment Patent does not need any  further PT services  PT Problem List         PT Treatment Interventions      PT Goals (Current goals can be found in the Care Plan section)  Acute Rehab PT Goals PT Goal Formulation: All assessment and education complete, DC therapy    Frequency     Barriers to discharge        Co-evaluation               AM-PAC PT "6 Clicks" Daily Activity  Outcome Measure Difficulty turning over in bed (including adjusting bedclothes, sheets and blankets)?: None Difficulty moving from lying on back to sitting on the side of the bed? : A Little Difficulty sitting down on and standing up from a chair with arms (e.g.,  wheelchair, bedside commode, etc,.)?: A Little Help needed moving to and from a bed to chair (including a wheelchair)?: A Little Help needed walking in hospital room?: A Little Help needed climbing 3-5 steps with a railing? : A Little 6 Click Score: 19    End of Session Equipment Utilized During Treatment: Oxygen Activity Tolerance: Patient tolerated treatment well Patient left: in bed;with family/visitor present   PT Visit Diagnosis: Muscle weakness (generalized) (M62.81)    Time: 1219-7588 PT Time Calculation (min) (ACUTE ONLY): 19 min   Charges:   PT Evaluation $PT Eval Low Complexity: 1 Low     PT G CodesMagda Kiel, [place] [phone] 02/18/2017   Reginia Naas 02/18/2017, 10:14 AM

## 2017-02-18 NOTE — Discharge Summary (Signed)
Physician Discharge Summary  Larry Mitchell BZJ:[id number] DOB: October 02, 1945 DOA: 02/15/2017  PCP: Myrla Halsted, MD  Admit date: 02/15/2017 Discharge date: 02/18/2017  Admitted From: Home Disposition:  Home  Recommendations for Outpatient Follow-up:  1. Follow up with PCP in 1 week 2. Please obtain CBC/BMP in 1 week to recheck Hgb and Cr. Patient was transfused blood with stabilization of Hgb; FOBT is negative and he denied any blood loss. Follow up outpatient. Cr stable in 2.2-2.5 range over the last 3 days. His diuretics/ACE are held at time of discharge. Check BMP as outpatient prior to resuming medications.   3. Please follow up on the following pending results: final blood culture results   Discharge Condition: Stable CODE STATUS: Full  Diet recommendation: Heart healthy   Brief/Interim Summary: Larry Mitchell is a 72 y.o. male with medical history significant for insulin-dependent diabetes mellitus, hypertension, coronary artery disease, chronic diastolic CHF, chronic kidney disease stage III, and COPD with chronic hypoxic respiratory failure, now presenting to the emergency department for evaluation of shortness of breath, cough, occasional chest pain, and hypoxia at home.  Patient reports that his chronic dyspnea has been worse over the past 2 weeks, but particularly over the past 2-3 days.  Over the past couple days, he has had worsening cough, shortness of breath, and has experienced occasional fleeting chest discomfort localized to the left lower chest with no alleviating or exacerbating factors identified.  He denies abdominal pain, headaches, vomiting, diarrhea, sore throat, or rhinorrhea.  Reports that his O2 saturation was 77% on his usual 2 L/min of supplemental oxygen today, prompting him to seek evaluation in the ED.  [facility] ED Course: Upon arrival to the ED, patient is found to be afebrile, saturating mid 90s on 4 L/min of supplemental oxygen, and with vitals otherwise  normal.  EKG features a sinus rhythm with RBBB, LAFB, and marked ST-T abnormality in the lateral leads, with T wave changes more pronounced than prior.  Chest x-ray is notable for emphysematous changes and left basilar airspace disease concerning for pneumonia.  Chemistry panel reveals a BUN of 57 and creatinine of 2.07, consistent with his apparent baseline.  CBC is notable for leukocytosis to 12,300 and a microcytic anemia with hemoglobin of 8.2 and MCV of 106.6.  D-dimer is elevated to 0.87, pro NT BNP elevated to 6911, and troponin is elevated to 0.11.  Patient was given a dose of Lovenox 1 mg/kg, DuoNeb, Rocephin, and azithromycin in the ED.  Cardiology was consulted by the ED physician, agreed to see the patient in consultation, recommended medical admission.  Patient remains hemodynamically stable, admitted to the telemetry unit for ongoing evaluation and management of community-acquired pneumonia with acute on chronic hypoxic respiratory failure and recent fleeting chest pains with elevated troponin and elevated d-dimer.  Interim course at [facility]:  He underwent VQ scan which was negative for PE. Heparin gtt was stopped. Cardiology evaluated patient due to elevated troponin and determined that it was not in ACS pattern as it was a flat trend. It is likely demand ischemic in setting of anemia, AKI, CAP. Echocardiogram was completed with results below. Hgb was low at 6.9 and patient was given blood transfusion with stabilization of Hgb. FOBT was negative and had no report of blood loss from cough, vomit, urine, stool. On morning of discharge, patient had no complaints, his O2 requirement was at baseline levels and clinically stable for discharge home. Discussed with wife at bedside. He is discharged  home with PO antibiotics (vantin and azithromycin).   Discharge Diagnoses:  Principal Problem:   CAP (community acquired pneumonia) Active Problems:   Elevated troponin   IDDM (insulin  dependent diabetes mellitus) (HCC)   CKD (chronic kidney disease) stage 3, GFR 30-59 ml/min (HCC)   Macrocytic anemia   Essential hypertension   PAT (paroxysmal atrial tachycardia) (HCC)   Positive D dimer   Acute on chronic respiratory failure with hypoxia (HCC)   SOB (shortness of breath)   Chronic diastolic HF (heart failure) ([place])   Demand ischemia [facility])    Discharge Instructions  Discharge Instructions    (HEART FAILURE PATIENTS) Call MD:  Anytime you have any of the following symptoms: 1) 3 pound weight gain in 24 hours or 5 pounds in 1 week 2) shortness of breath, with or without a dry hacking cough 3) swelling in the hands, feet or stomach 4) if you have to sleep on extra pillows at night in order to breathe.   Complete by:  As directed    Call MD for:  difficulty breathing, headache or visual disturbances   Complete by:  As directed    Call MD for:  extreme fatigue   Complete by:  As directed    Call MD for:  hives   Complete by:  As directed    Call MD for:  persistant dizziness or light-headedness   Complete by:  As directed    Call MD for:  persistant nausea and vomiting   Complete by:  As directed    Call MD for:  severe uncontrolled pain   Complete by:  As directed    Call MD for:  temperature >100.4   Complete by:  As directed    Diet - low sodium heart healthy   Complete by:  As directed    Discharge instructions   Complete by:  As directed    You were cared for by a hospitalist during your hospital stay. If you have any questions about your discharge medications or the care you received while you were in the hospital after you are discharged, you can call the unit and asked to speak with the hospitalist on call if the hospitalist that took care of you is not available. Once you are discharged, your primary care physician will handle any further medical issues. Please note that NO REFILLS for any discharge medications will be authorized once you are discharged, as  it is imperative that you return to your primary care physician (or establish a relationship with a primary care physician if you do not have one) for your aftercare needs so that they can reassess your need for medications and monitor your lab values.   Increase activity slowly   Complete by:  As directed      Allergies as of 02/18/2017   No Known Allergies     Medication List    STOP taking these medications   furosemide 20 MG tablet Commonly known as:  LASIX   lisinopril 40 MG tablet Commonly known as:  PRINIVIL,ZESTRIL   spironolactone 50 MG tablet Commonly known as:  ALDACTONE     TAKE these medications   albuterol 108 (90 Base) MCG/ACT inhaler Commonly known as:  PROVENTIL HFA;VENTOLIN HFA Inhale 1 puff into the lungs every 6 (six) hours as needed for wheezing or shortness of breath.   aspirin 81 MG tablet Take 1 tablet (81 mg total) by mouth daily.   atorvastatin 20 MG tablet Commonly known as:  LIPITOR Take 20 mg by mouth daily.   azithromycin 500 MG tablet Commonly known as:  ZITHROMAX Take 1 tablet (500 mg total) by mouth daily for 2 days. Take 1 tablet daily for 3 days.   cefpodoxime 100 MG tablet Commonly known as:  VANTIN Take 1 tablet (100 mg total) by mouth 2 (two) times daily for 2 days.   Fish Oil 1000 MG Cpdr Take 1,000 mg by mouth 2 (two) times daily.   hydrALAZINE 10 MG tablet Commonly known as:  APRESOLINE Take 30 mg by mouth 2 (two) times daily.   insulin NPH-regular Human (70-30) 100 UNIT/ML injection Commonly known as:  NOVOLIN 70/30 Inject 15 Units into the skin 2 (two) times daily with a meal. What changed:  how much to take   loratadine 10 MG tablet Commonly known as:  CLARITIN Take 10 mg by mouth daily.   metoprolol succinate 50 MG 24 hr tablet Commonly known as:  TOPROL-XL Take 50 mg by mouth daily. Take with or immediately following a meal.   multivitamin tablet Take 1 tablet by mouth daily.   omeprazole 20 MG  capsule Commonly known as:  PRILOSEC Take 20 mg by mouth daily.   patiromer 8.4 g packet Commonly known as:  VELTASSA Take 8.4 g by mouth daily.   PRESERVISION AREDS PO Take 1 capsule by mouth 2 (two) times daily.   tiotropium 18 MCG inhalation capsule Commonly known as:  SPIRIVA Place 18 mcg into inhaler and inhale daily.      Follow-up Information    Myrla Halsted, MD. Schedule an appointment as soon as possible for a visit in 1 week(s).   Specialty:  Family Medicine Contact information: [facility] [place]. [place] [address] [phone]          No Known Allergies  Consultations:  Cardiology    Procedures/Studies: Dg Chest 2 View  Result Date: 02/16/2017 CLINICAL DATA:  Shortness of breath. EXAM: CHEST  2 VIEW COMPARISON:  Chest x-ray from yesterday. FINDINGS: The heart size and mediastinal contours are within normal limits. Normal pulmonary vascularity. The lungs remain hyperinflated. Unchanged patchy opacities in the left lower lobe. Trace bilateral pleural effusions. No pneumothorax. No acute osseous abnormality. IMPRESSION: 1. Unchanged left lower lobe airspace disease, concerning for pneumonia. 2. Trace bilateral pleural effusions. 3. COPD. Electronically Signed   By: Titus Dubin M.D.   On: 02/16/2017 09:09   Nm Pulmonary Perf And Vent  Result Date: 02/16/2017 CLINICAL DATA:  Evaluate for suspected pulmonary embolism. Positive D-dimer. Shortness of breath and cough with occasional chest pain and hypoxia. Chronic dyspnea worse over the past 2 weeks. EXAM: NUCLEAR MEDICINE VENTILATION - PERFUSION LUNG SCAN TECHNIQUE: Ventilation images were obtained in multiple projections using inhaled aerosol Tc-14m DTPA. Perfusion images were obtained in multiple projections after intravenous injection of Tc-30m-MAA. RADIOPHARMACEUTICALS:  30.3 mCi of Tc-18m DTPA aerosol inhalation and 4.23 mCi Tc74m-MAA IV COMPARISON:  Chest x-ray 02/16/2017 and 02/15/2017  FINDINGS: Recent chest x-ray demonstrates small bilateral pleural effusions and possible lingular airspace process as well as possible emphysematous disease. Ventilation: Central airway clumping of the radiotracer. Mild bilateral heterogeneous radiotracer uptake worse than the perfusion images suggesting emphysematous/small airways disease. Perfusion: No wedge shaped peripheral perfusion defects to suggest acute pulmonary embolism. Mild bilateral nonsegmental defects improved compared to the ventilation images. IMPRESSION: Low probability for pulmonary embolism. Electronically Signed   By: Marin Olp M.D.   On: 02/16/2017 09:31    Echo Study Conclusions - Left ventricle:  The cavity size was normal. There was mild focal   basal hypertrophy of the septum. Systolic function was normal.   The estimated ejection fraction was in the range of 55% to 60%.   There is akinesis of the mid-apicalinferolateral myocardium.   Features are consistent with a pseudonormal left ventricular   filling pattern, with concomitant abnormal relaxation and   increased filling pressure (grade 2 diastolic dysfunction).   Doppler parameters are consistent with high ventricular filling   pressure. - Aortic valve: Trileaflet; normal thickness, mildly calcified   leaflets. - Mitral valve: There was mild regurgitation. - Left atrium: The atrium was moderately to severely dilated. - Pulmonary arteries: PA peak pressure: 45 mm Hg (S).  Impressions:  - The right ventricular systolic pressure was increased consistent with moderate pulmonary hypertension.    Discharge Exam: Vitals:   02/18/17 0540 02/18/17 0938  BP: (!) 160/68 (!) 186/75  Pulse: 71 81  Resp: 18   Temp: (!) 97.4 F (36.3 C)   SpO2: 98%    Vitals:   02/17/17 2110 02/17/17 2119 02/18/17 0540 02/18/17 0938  BP: (!) 169/66  (!) 160/68 (!) 186/75  Pulse: 73 76 71 81  Resp:   18   Temp:  98.4 F (36.9 C) (!) 97.4 F (36.3 C)   TempSrc:  Oral Oral    SpO2: 97%  98%   Weight:   63 kg (139 lb)   Height:        General: Pt is alert, awake, not in acute distress Cardiovascular: RRR, S1/S2 +, no rubs, no gallops Respiratory: Crackles right base, no wheezing, no rhonchi Abdominal: Soft, NT, ND, bowel sounds + Extremities: +trace edema, no cyanosis    The results of significant diagnostics from this hospitalization (including imaging, microbiology, ancillary and laboratory) are listed below for reference.     Microbiology: Recent Results (from the past 240 hour(s))  Culture, blood (routine x 2) Call MD if unable to obtain prior to antibiotics being given     Status: None (Preliminary result)   Collection Time: 02/16/17  2:30 AM  Result Value Ref Range Status   Specimen Description BLOOD RIGHT ARM  Final   Special Requests   Final    AEROBIC BOTTLE ONLY Blood Culture results may not be optimal due to an inadequate volume of blood received in culture bottles   Culture   Final    NO GROWTH 1 DAY Performed at [facility], [place] [address], [place], [place] [address]    Report Status PENDING  Incomplete  Culture, blood (routine x 2) Call MD if unable to obtain prior to antibiotics being given     Status: None (Preliminary result)   Collection Time: 02/16/17  2:30 AM  Result Value Ref Range Status   Specimen Description BLOOD LEFT HAND  Final   Special Requests   Final    AEROBIC BOTTLE ONLY Blood Culture results may not be optimal due to an inadequate volume of blood received in culture bottles   Culture   Final    NO GROWTH 1 DAY Performed at [facility], [place] [address], [place], [place] [address]    Report Status PENDING  Incomplete     Labs: BNP (last 3 results) Recent Labs    02/16/17 0011  BNP 010.2*   Basic Metabolic Panel: Recent Labs  Lab 02/16/17 0011 02/17/17 0335 02/17/17 2227 02/18/17 0453  NA 140 142 137 141  K 3.4* 3.8 3.9 3.9  CL 105 108 104 106  CO2 23 25 23 24   GLUCOSE 142* 169* 285*  199*  BUN 56* 63* 60* 57*  CREATININE 2.24* 2.57* 2.46* 2.38*  CALCIUM 8.3* 7.8* 7.6* 8.0*  MG  --   --  2.0  --    Liver Function Tests: Recent Labs  Lab 02/16/17 0011  AST 25  ALT 19  ALKPHOS 64  BILITOT 0.8  PROT 6.0*  ALBUMIN 2.6*   No results for input(s): LIPASE, AMYLASE in the last 168 hours. No results for input(s): AMMONIA in the last 168 hours. CBC: Recent Labs  Lab 02/16/17 0011 02/17/17 0335 02/17/17 1522 02/18/17 0453  WBC 10.9* 6.8  --  6.7  NEUTROABS 9.1* 5.1  --  5.3  HGB 7.7* 6.9* 8.9* 9.1*  HCT 22.7* 20.5* 26.8* 27.3*  MCV 104.6* 106.2*  --  103.8*  PLT 217 226  --  238   Cardiac Enzymes: Recent Labs  Lab 02/16/17 0011 02/16/17 0230 02/16/17 0940  TROPONINI 0.04* 0.03* 0.03*   BNP: Invalid input(s): POCBNP CBG: Recent Labs  Lab 02/17/17 0743 02/17/17 1123 02/17/17 1700 02/17/17 2118 02/18/17 0736  GLUCAP 77 223* 207* 260* 150*   D-Dimer No results for input(s): DDIMER in the last 72 hours. Hgb A1c No results for input(s): HGBA1C in the last 72 hours. Lipid Profile No results for input(s): CHOL, HDL, LDLCALC, TRIG, CHOLHDL, LDLDIRECT in the last 72 hours. Thyroid function studies No results for input(s): TSH, T4TOTAL, T3FREE, THYROIDAB in the last 72 hours.  Invalid input(s): FREET3 Anemia work up Recent Labs    02/16/17 0230  VITAMINB12 413  FOLATE 38.0  FERRITIN 122  TIBC 232*  IRON 20*  RETICCTPCT 1.9   Urinalysis    Component Value Date/Time   COLORURINE YELLOW 02/17/2017 1506   APPEARANCEUR CLEAR 02/17/2017 1506   LABSPEC 1.013 02/17/2017 1506   PHURINE 5.0 02/17/2017 1506   GLUCOSEU 150 (A) 02/17/2017 1506   HGBUR NEGATIVE 02/17/2017 1506   BILIRUBINUR NEGATIVE 02/17/2017 1506   [place] 02/17/2017 1506   PROTEINUR 100 (A) 02/17/2017 1506   NITRITE NEGATIVE 02/17/2017 1506   LEUKOCYTESUR NEGATIVE 02/17/2017 1506   Sepsis Labs Invalid input(s): PROCALCITONIN,  WBC,   LACTICIDVEN Microbiology Recent Results (from the past 240 hour(s))  Culture, blood (routine x 2) Call MD if unable to obtain prior to antibiotics being given     Status: None (Preliminary result)   Collection Time: 02/16/17  2:30 AM  Result Value Ref Range Status   Specimen Description BLOOD RIGHT ARM  Final   Special Requests   Final    AEROBIC BOTTLE ONLY Blood Culture results may not be optimal due to an inadequate volume of blood received in culture bottles   Culture   Final    NO GROWTH 1 DAY Performed at [facility], [place] [address] [place], [place] [address]    Report Status PENDING  Incomplete  Culture, blood (routine x 2) Call MD if unable to obtain prior to antibiotics being given     Status: None (Preliminary result)   Collection Time: 02/16/17  2:30 AM  Result Value Ref Range Status   Specimen Description BLOOD LEFT HAND  Final   Special Requests   Final    AEROBIC BOTTLE ONLY Blood Culture results may not be optimal due to an inadequate volume of blood received in culture bottles   Culture   Final    NO GROWTH 1 DAY Performed at [facility], [place] [address] [place], [place] [address]    Report Status PENDING  Incomplete     Time coordinating discharge: 40 minutes  SIGNED:  Dessa Phi, DO [facility] Pager [phone]  If 7PM-7AM, please contact night-coverage [email or web address] Password [email or web address] 02/18/2017, 1:16 PM

## 2017-02-21 ENCOUNTER — Other Ambulatory Visit: Payer: Self-pay

## 2017-02-21 LAB — CULTURE, BLOOD (ROUTINE X 2)
CULTURE: NO GROWTH
Culture: NO GROWTH

## 2017-02-21 NOTE — Consult Note (Signed)
            [facility] CM Primary Care Navigator  02/21/2017  Larry Mitchell 01-29-1945 [id number]   Attempt to seepatient at the bedsideto identify possible discharge needs but he was alreadydischarged home.   PerMD note,patient was seen for shortness of breath, cough, occasional chest pain, and hypoxia at home.  Patient has discharge instruction to follow-up with primary care provider in 1 week. Attempted to call primary care provider's office shown on BI Report (Dr. Judd Lien) and spoke to [facility] [facility] who confirmed that patient is not being seen in their office/ practice.  Primary care provider listed (in Epic) is Dr. Myrla Halsted with [facility] in [place], [place] and not under [facility].   For additional questions please contact:  Edwena Felty A. Burley Kopka, BSN, RN-BC [facility] PRIMARY CARE Navigator Cell: [phone]

## 2017-02-21 NOTE — Patient Outreach (Signed)
[facility]) Care Management  02/21/2017  KENNET MCCORT [birth year] [id number]   [employer] referral received: discharged from an inpatient admission from [facility] on 02/18/17. RNCM called regarding transition of care. Two patient identifiers confirmed. Mr. Palazzi states his primary care provider is Dr. Tarri Glenn at the [facility] center. He reports he had a follow up appointment with her on yesterday. He states he has never seen Dr. Ace Gins or any primary care provider outside of Dr. Payton Emerald in [place]. Therefore Client is not eligible for care management services.  Plan: RNCM will not open case.  Thea Silversmith, RN, MSN, [place] Coordinator Cell: [phone]

## 2017-04-27 DIAGNOSIS — I272 Pulmonary hypertension, unspecified: Secondary | ICD-10-CM | POA: Diagnosis not present

## 2017-04-27 DIAGNOSIS — I5033 Acute on chronic diastolic (congestive) heart failure: Secondary | ICD-10-CM | POA: Diagnosis not present

## 2017-04-27 DIAGNOSIS — I132 Hypertensive heart and chronic kidney disease with heart failure and with stage 5 chronic kidney disease, or end stage renal disease: Secondary | ICD-10-CM | POA: Diagnosis not present

## 2017-04-27 DIAGNOSIS — E1122 Type 2 diabetes mellitus with diabetic chronic kidney disease: Secondary | ICD-10-CM | POA: Diagnosis not present

## 2017-04-27 DIAGNOSIS — I21A1 Myocardial infarction type 2: Secondary | ICD-10-CM | POA: Diagnosis not present

## 2017-04-27 DIAGNOSIS — D472 Monoclonal gammopathy: Secondary | ICD-10-CM | POA: Diagnosis not present

## 2017-04-27 DIAGNOSIS — J449 Chronic obstructive pulmonary disease, unspecified: Secondary | ICD-10-CM | POA: Diagnosis not present

## 2017-04-27 DIAGNOSIS — I2510 Atherosclerotic heart disease of native coronary artery without angina pectoris: Secondary | ICD-10-CM | POA: Diagnosis not present

## 2017-04-27 DIAGNOSIS — D539 Nutritional anemia, unspecified: Secondary | ICD-10-CM | POA: Diagnosis not present

## 2017-04-30 DIAGNOSIS — I469 Cardiac arrest, cause unspecified: Secondary | ICD-10-CM | POA: Diagnosis not present

## 2017-05-01 DIAGNOSIS — 419620001 Death: Secondary | SNOMED CT | POA: Diagnosis not present

## 2017-05-01 DEATH — deceased

## 2019-06-16 IMAGING — NM NM PULMONARY VENT & PERF
15 series · 15 of 15 positions shown · non-contrast
Comparison: Chest x-ray 02/16/2017 and 02/15/2017

CLINICAL DATA: Evaluate for suspected pulmonary embolism. Positive
D-dimer. Shortness of breath and cough with occasional chest pain
and hypoxia. Chronic dyspnea worse over the past 2 weeks.

EXAM:
NUCLEAR MEDICINE VENTILATION - PERFUSION LUNG SCAN
TECHNIQUE: Ventilation images were obtained in multiple projections using
inhaled aerosol Mc-99m DTPA. Perfusion images were obtained in
multiple projections after intravenous injection of Dc-44m-YGG.
RADIOPHARMACEUTICALS:  30.3 mCi of Mc-99m DTPA aerosol inhalation
and 4.23 mCi 4cEEm-AEE IV

[Series 1: ant/post vent · 4.14mm/px · 1 of 1 slices shown (1 of 2)]
[im 1/1]
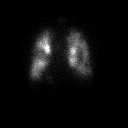

[Series 1: ant/post vent · 4.14mm/px · 1 of 1 slices shown (2 of 2)]
[im 1/1]
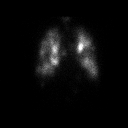

[Series 2: lao/rpo vent · 4.14mm/px · 1 of 1 slices shown (1 of 2)]
[im 1/1]
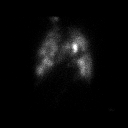

[Series 2: lao/rpo vent · 4.14mm/px · 1 of 1 slices shown (2 of 2)]
[im 1/1]
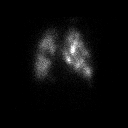

[Series 3: lpo/rao vent · 4.14mm/px · 1 of 1 slices shown (1 of 2)]
[im 1/1]
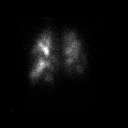

[Series 3: lpo/rao vent · 4.14mm/px · 1 of 1 slices shown (2 of 2)]
[im 1/1]
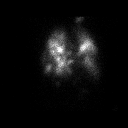

[Series 4: lt lat/rt lat vent · 4.14mm/px · 1 of 1 slices shown (1 of 2)]
[im 1/1]
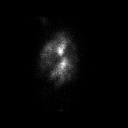

[Series 4: lt lat/rt lat vent · 4.14mm/px · 1 of 1 slices shown (2 of 2)]
[im 1/1]
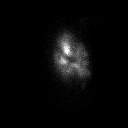

[Series 5: lt lat/rt lat perf · 4.14mm/px · 1 of 1 slices shown (1 of 2)]
[im 1/1]
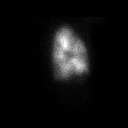

[Series 5: lt lat/rt lat perf · 4.14mm/px · 1 of 1 slices shown (2 of 2)]
[im 1/1]
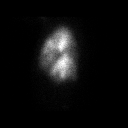

[Series 6: lpo/rao perf · 4.14mm/px · 1 of 1 slices shown (1 of 2)]
[im 1/1]
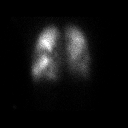

[Series 6: lpo/rao perf · 4.14mm/px · 1 of 1 slices shown (2 of 2)]
[im 1/1]
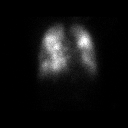

[Series 7: ant/post perf · 4.14mm/px · 1 of 1 slices shown]
[im 1/1]
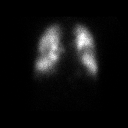

[Series 8: lao/rpo perf · 4.14mm/px · 1 of 1 slices shown (1 of 2)]
[im 1/1]
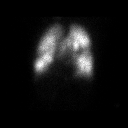

[Series 8: lao/rpo perf · 4.14mm/px · 1 of 1 slices shown (2 of 2)]
[im 1/1]
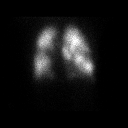

[15 of 15 positions shown; findings below may reference images not displayed]

FINDINGS: Recent chest x-ray demonstrates small bilateral pleural effusions
and possible lingular airspace process as well as possible
emphysematous disease.

Ventilation: Central airway clumping of the radiotracer. Mild
bilateral heterogeneous radiotracer uptake worse than the perfusion
images suggesting emphysematous/small airways disease.

Perfusion: No wedge shaped peripheral perfusion defects to suggest
acute pulmonary embolism. Mild bilateral nonsegmental defects
improved compared to the ventilation images.
IMPRESSION: Low probability for pulmonary embolism.
